# Patient Record
Sex: Male | Born: 1945 | Race: White | Hispanic: No | Marital: Married | Smoking: Never smoker
Health system: Southern US, Community
[De-identification: ages and names within clinical notes are randomized; demographics above are authoritative.]

## PROBLEM LIST (undated history)

## (undated) DIAGNOSIS — E039 Hypothyroidism, unspecified: Secondary | ICD-10-CM

## (undated) DIAGNOSIS — K219 Gastro-esophageal reflux disease without esophagitis: Secondary | ICD-10-CM

## (undated) DIAGNOSIS — I10 Essential (primary) hypertension: Secondary | ICD-10-CM

## (undated) HISTORY — DX: Gastro-esophageal reflux disease without esophagitis: K21.9

## (undated) HISTORY — DX: Essential (primary) hypertension: I10

## (undated) HISTORY — PX: THYROIDECTOMY: SHX17

## (undated) HISTORY — DX: Hypothyroidism, unspecified: E03.9

---

## 1998-03-31 ENCOUNTER — Encounter: Admission: RE | Admit: 1998-03-31 | Discharge: 1998-03-31 | Payer: Self-pay | Admitting: Internal Medicine

## 1998-04-19 ENCOUNTER — Ambulatory Visit (HOSPITAL_COMMUNITY): Admission: RE | Admit: 1998-04-19 | Discharge: 1998-04-19 | Payer: Self-pay | Admitting: *Deleted

## 1998-04-25 ENCOUNTER — Encounter: Payer: Self-pay | Admitting: *Deleted

## 1998-04-25 ENCOUNTER — Ambulatory Visit (HOSPITAL_COMMUNITY): Admission: RE | Admit: 1998-04-25 | Discharge: 1998-04-25 | Payer: Self-pay | Admitting: *Deleted

## 1998-06-07 ENCOUNTER — Encounter: Admission: RE | Admit: 1998-06-07 | Discharge: 1998-06-07 | Payer: Self-pay

## 1998-10-17 ENCOUNTER — Ambulatory Visit (HOSPITAL_COMMUNITY): Admission: RE | Admit: 1998-10-17 | Discharge: 1998-10-17 | Payer: Self-pay | Admitting: Urology

## 1998-10-17 ENCOUNTER — Encounter: Payer: Self-pay | Admitting: Urology

## 2004-01-04 ENCOUNTER — Ambulatory Visit: Payer: Self-pay | Admitting: Internal Medicine

## 2004-02-28 ENCOUNTER — Ambulatory Visit: Payer: Self-pay | Admitting: Family Medicine

## 2004-06-05 ENCOUNTER — Ambulatory Visit: Payer: Self-pay | Admitting: Internal Medicine

## 2004-06-12 ENCOUNTER — Ambulatory Visit: Payer: Self-pay | Admitting: Internal Medicine

## 2004-12-04 ENCOUNTER — Ambulatory Visit: Payer: Self-pay | Admitting: Internal Medicine

## 2004-12-11 ENCOUNTER — Ambulatory Visit: Payer: Self-pay | Admitting: Internal Medicine

## 2005-02-08 ENCOUNTER — Ambulatory Visit: Payer: Self-pay | Admitting: Internal Medicine

## 2005-05-03 ENCOUNTER — Ambulatory Visit: Payer: Self-pay | Admitting: Internal Medicine

## 2005-10-22 ENCOUNTER — Encounter: Payer: Self-pay | Admitting: Internal Medicine

## 2005-10-22 ENCOUNTER — Ambulatory Visit: Payer: Self-pay | Admitting: Internal Medicine

## 2005-10-22 DIAGNOSIS — I10 Essential (primary) hypertension: Secondary | ICD-10-CM | POA: Insufficient documentation

## 2005-10-22 DIAGNOSIS — E039 Hypothyroidism, unspecified: Secondary | ICD-10-CM

## 2006-01-03 ENCOUNTER — Ambulatory Visit: Payer: Self-pay | Admitting: Internal Medicine

## 2006-01-03 LAB — CONVERTED CEMR LAB: TSH: 1.29 microintl units/mL (ref 0.35–5.50)

## 2007-04-15 ENCOUNTER — Ambulatory Visit: Payer: Self-pay | Admitting: Internal Medicine

## 2007-04-15 DIAGNOSIS — R61 Generalized hyperhidrosis: Secondary | ICD-10-CM

## 2007-04-16 LAB — CONVERTED CEMR LAB
BUN: 12 mg/dL (ref 6–23)
CO2: 30 meq/L (ref 19–32)
Calcium: 9.1 mg/dL (ref 8.4–10.5)
Chloride: 109 meq/L (ref 96–112)
Creatinine, Ser: 1 mg/dL (ref 0.4–1.5)
GFR calc Af Amer: 98 mL/min
GFR calc non Af Amer: 81 mL/min
Glucose, Bld: 94 mg/dL (ref 70–99)
Potassium: 4.7 meq/L (ref 3.5–5.1)
Sodium: 143 meq/L (ref 135–145)
TSH: 2.72 microintl units/mL (ref 0.35–5.50)

## 2007-05-13 ENCOUNTER — Ambulatory Visit: Payer: Self-pay | Admitting: Internal Medicine

## 2007-07-07 ENCOUNTER — Ambulatory Visit: Payer: Self-pay | Admitting: Internal Medicine

## 2007-07-13 ENCOUNTER — Ambulatory Visit: Payer: Self-pay | Admitting: Internal Medicine

## 2008-02-29 ENCOUNTER — Ambulatory Visit: Payer: Self-pay | Admitting: Internal Medicine

## 2008-03-01 LAB — CONVERTED CEMR LAB
BUN: 15 mg/dL (ref 6–23)
CO2: 25 meq/L (ref 19–32)
Calcium: 9.5 mg/dL (ref 8.4–10.5)
Chloride: 107 meq/L (ref 96–112)
Creatinine, Ser: 0.9 mg/dL (ref 0.4–1.5)
GFR calc Af Amer: 111 mL/min
GFR calc non Af Amer: 91 mL/min
Glucose, Bld: 97 mg/dL (ref 70–99)
Potassium: 4.6 meq/L (ref 3.5–5.1)
Sodium: 144 meq/L (ref 135–145)
TSH: 1 microintl units/mL (ref 0.35–5.50)

## 2008-10-14 ENCOUNTER — Ambulatory Visit: Payer: Self-pay | Admitting: Internal Medicine

## 2008-10-14 DIAGNOSIS — G4710 Hypersomnia, unspecified: Secondary | ICD-10-CM

## 2008-10-17 LAB — CONVERTED CEMR LAB
ALT: 25 units/L (ref 0–53)
AST: 23 units/L (ref 0–37)
Albumin: 4.5 g/dL (ref 3.5–5.2)
Alkaline Phosphatase: 45 units/L (ref 39–117)
BUN: 14 mg/dL (ref 6–23)
Basophils Absolute: 0 10*3/uL (ref 0.0–0.1)
Basophils Relative: 0.3 % (ref 0.0–3.0)
Bilirubin, Direct: 0 mg/dL (ref 0.0–0.3)
CO2: 31 meq/L (ref 19–32)
Calcium: 9.1 mg/dL (ref 8.4–10.5)
Chloride: 107 meq/L (ref 96–112)
Cholesterol: 157 mg/dL (ref 0–200)
Creatinine, Ser: 1 mg/dL (ref 0.4–1.5)
Eosinophils Absolute: 0.2 10*3/uL (ref 0.0–0.7)
Eosinophils Relative: 3.5 % (ref 0.0–5.0)
GFR calc non Af Amer: 80.72 mL/min (ref >60)
Glucose, Bld: 103 mg/dL — ABNORMAL HIGH (ref 70–99)
HCT: 44.5 % (ref 39.0–52.0)
HDL: 38.6 mg/dL — ABNORMAL LOW (ref >39.00)
Hemoglobin: 15 g/dL (ref 13.0–17.0)
LDL Cholesterol: 105 mg/dL — ABNORMAL HIGH (ref 0–99)
Lymphocytes Relative: 20.7 % (ref 12.0–46.0)
Lymphs Abs: 1.3 10*3/uL (ref 0.7–4.0)
MCHC: 33.8 g/dL (ref 30.0–36.0)
MCV: 87 fL (ref 78.0–100.0)
Monocytes Absolute: 0.4 10*3/uL (ref 0.1–1.0)
Monocytes Relative: 6.6 % (ref 3.0–12.0)
Neutro Abs: 4.5 10*3/uL (ref 1.4–7.7)
Neutrophils Relative %: 68.9 % (ref 43.0–77.0)
PSA: 2.71 ng/mL (ref 0.10–4.00)
Platelets: 179 10*3/uL (ref 150.0–400.0)
Potassium: 4.7 meq/L (ref 3.5–5.1)
RBC: 5.11 M/uL (ref 4.22–5.81)
RDW: 12.2 % (ref 11.5–14.6)
Sodium: 144 meq/L (ref 135–145)
TSH: 0.68 microintl units/mL (ref 0.35–5.50)
Total Bilirubin: 1.2 mg/dL (ref 0.3–1.2)
Total CHOL/HDL Ratio: 4
Total Protein: 7 g/dL (ref 6.0–8.3)
Triglycerides: 68 mg/dL (ref 0.0–149.0)
VLDL: 13.6 mg/dL (ref 0.0–40.0)
WBC: 6.4 10*3/uL (ref 4.5–10.5)

## 2008-11-01 ENCOUNTER — Encounter: Payer: Self-pay | Admitting: Internal Medicine

## 2009-03-14 ENCOUNTER — Ambulatory Visit: Payer: Self-pay | Admitting: Internal Medicine

## 2009-07-31 ENCOUNTER — Telehealth: Payer: Self-pay | Admitting: Internal Medicine

## 2009-08-07 ENCOUNTER — Telehealth: Payer: Self-pay | Admitting: Internal Medicine

## 2009-08-29 ENCOUNTER — Encounter: Payer: Self-pay | Admitting: Internal Medicine

## 2009-08-30 ENCOUNTER — Ambulatory Visit: Payer: Self-pay | Admitting: Internal Medicine

## 2009-08-30 DIAGNOSIS — R079 Chest pain, unspecified: Secondary | ICD-10-CM | POA: Insufficient documentation

## 2009-08-31 LAB — CONVERTED CEMR LAB
Basophils Absolute: 0.1 10*3/uL (ref 0.0–0.1)
Basophils Relative: 1.3 % (ref 0.0–3.0)
CK-MB: 3.6 ng/mL (ref 0.3–4.0)
Eosinophils Absolute: 0.2 10*3/uL (ref 0.0–0.7)
Eosinophils Relative: 3.9 % (ref 0.0–5.0)
HCT: 42.6 % (ref 39.0–52.0)
Hemoglobin: 14.7 g/dL (ref 13.0–17.0)
Lymphocytes Relative: 24.7 % (ref 12.0–46.0)
Lymphs Abs: 1.5 10*3/uL (ref 0.7–4.0)
MCHC: 34.5 g/dL (ref 30.0–36.0)
MCV: 87.2 fL (ref 78.0–100.0)
Monocytes Absolute: 0.5 10*3/uL (ref 0.1–1.0)
Monocytes Relative: 8.1 % (ref 3.0–12.0)
Neutro Abs: 3.9 10*3/uL (ref 1.4–7.7)
Neutrophils Relative %: 62 % (ref 43.0–77.0)
Platelets: 193 10*3/uL (ref 150.0–400.0)
RBC: 4.89 M/uL (ref 4.22–5.81)
RDW: 13.4 % (ref 11.5–14.6)
Total CK: 194 units/L (ref 7–232)
WBC: 6.2 10*3/uL (ref 4.5–10.5)

## 2009-10-24 ENCOUNTER — Encounter (INDEPENDENT_AMBULATORY_CARE_PROVIDER_SITE_OTHER): Payer: Self-pay | Admitting: *Deleted

## 2010-02-20 NOTE — Assessment & Plan Note (Signed)
Summary: consult re: ems visit vesterday re: chest/discuss ekg that wa...   Vital Signs:  Patient profile:   65 year old male Weight:      253 pounds BMI:     33.96 Temp:     98.2 degrees F oral Pulse rate:   52 / minute Pulse rhythm:   regular Resp:     12 per minute BP sitting:   126 / 70  (left arm) Cuff size:   regular  Vitals Entered By: Gladis Riffle, RN (August 30, 2009 11:22 AM) CC: FU gas and chest discomfort last night; went to EMS station and had EKG done-- Is Patient Diabetic? No   CC:  FU gas and chest discomfort last night; went to EMS station and had EKG done--.  History of Present Illness: yesterday evening he had a lot of "gas" and bloating associated with right sided chest discomfort.  pain was mild---seemed to be worse when "breathing in" no nausea no radiation of pain He went to EMS station---had EKG--- he feels well this a.m.  Last week had one day of fatigue and not feeling well---sxs completely resolved within 24 hours.  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 Take 1 Tablet By Mouth Once A Day 2)  Lisinopril 40 Mg Tabs (Lisinopril) .... Take 1 Tablet By Mouth Once A Day--Make Appt For September 2011 3)  Drysol 20 %  Soln (Aluminum Chloride) .... Use Twice Weekly Prn 4)  Triamcinolone Acetonide 0.5 % Crea (Triamcinolone Acetonide) .... Apply Bid To Affected Area As Needed 5)  Levothroid 25 Mcg Tabs (Levothyroxine Sodium) .... Take 1/2 Daily in Addition To  Allergies (verified): No Known Drug Allergies  Past  History:  Past Medical History: Last updated: 07/07/2007 Hypertension Hypothyroidism  Past Surgical History: Last updated: 10/22/2005 Thyroidectomy cold nodule  Family History: Last updated: 04/15/2007 Family Hsitory Breast cancer 1st degree relative <50 age 64--mother father deceased pneumonia Family History Hypertension-brother, mother  Social History: Last updated: 07/07/2007 Married Never Smoked Regular exercise-no  Risk Factors: Exercise: no (10/22/2005)  Risk Factors: Smoking Status: never (08/30/2009)  Physical Exam  General:  overweight-appearing.  normal blood pressureoverweight-appearing.   Head:  normocephalic and atraumatic.   Eyes:  pupils equal and pupils round.   Ears:  R ear normal and L ear normal.   Neck:  No deformities, masses, or tenderness noted. Lungs:  normal respiratory effort and no intercostal retractions.   Heart:  normal rate and regular rhythm.   Abdomen:  soft and non-tender.   Skin:  turgor normal and color normal.   Psych:  good eye contact and not anxious appearing.     Impression & Recommendations:  Problem # 1:  CHEST PAIN (ICD-786.50) unclear etiology reviewed EKG---no concerning findings cp is atypical he will call if sxs recur Orders: Venipuncture ([address]) Specimen Handling ([address]) TLB-CBC Platelet - w/Differential (85025-CBCD) TLB-CK-MB (Creatine Kinase MB) (82553-CKMB) TLB-CK Total Only(Creatine Kinase/CPK) (82550-CK)  Complete Medication List: 1)  Levothroid 200 Mcg Tabs (Levothyroxine sodium) .... Take 1 tablet by mouth once a day 2)  Lisinopril 40 Mg Tabs (  Lisinopril) .... Take 1 tablet by mouth once a day--make appt for september 2011 3)  Drysol 20 % Soln (Aluminum chloride) .... Use twice weekly prn 4)  Triamcinolone Acetonide 0.5 % Crea (Triamcinolone acetonide) .... Apply bid to affected area as needed 5)  Levothroid 25 Mcg Tabs (Levothyroxine sodium) .... Take 1/2 daily in addition to  Other  Orders: Sleep Disorder Referral (Sleep Disorder) Gastroenterology Referral (GI)

## 2010-02-20 NOTE — Letter (Signed)
Summary: [facility] Referral (unable to schedule) Notification  [facility]  [address] [place], [place] [address]   Phone: [phone]  Fax: [phone]      October 24, 2009 QUANTAY ZAREMBA 06/19/1945 MRN: [id number]   ELYE HARMSEN [address] [place], [place]  [address]   Dear Dr. Cato Mulligan:   Thank you for your kind referral of the above patient. We have attempted to schedule the recommended Colonoscopy but have been unable to schedule because:  _x_ The patient was not available by phone and/or has not returned our calls.  __ The patient declined to schedule the procedure at this time.  We appreciate the referral and hope that we will have the opportunity to treat this patient in the future.    Sincerely,   [facility]  Vania Rea. Jarold Motto M.D. Hedwig Morton. Juanda Chance M.D. Venita Lick. Russella Dar M.D. Wilhemina Bonito. Marina Goodell M.D. Barbette Hair. Arlyce Dice M.D. Iva Boop M.D. Cheron Every.D.

## 2010-02-20 NOTE — Assessment & Plan Note (Signed)
Summary: chest congestion/dm   Vital Signs:  Patient profile:   65 year old male Weight:      254 pounds Temp:     98.4 degrees F oral BP sitting:   110 / 68  (right arm) Cuff size:   large  Vitals Entered By: Duard Brady LPN (March 14, 2009 3:36 PM) CC: c/o non-productive cough , congestion x1wk  Is Patient Diabetic? No   CC:  c/o non-productive cough  and congestion x1wk .  History of Present Illness: 65 year old patient with a 6 day history of cold symptoms.  Complaints include nonproductive cough head and chest congestion and sore throat.  No documented fever or chills.  He does treated hypertension and hypothyroidism.  Denies any sputum production, shortness of breath or chest pain.  His hypertension has been well controlled  Preventive Screening-Counseling & Management  Alcohol-Tobacco     Smoking Status: never  Allergies (verified): No Known Drug Allergies  Past History:  Past Medical History: Reviewed history from 07/07/2007 and no changes required. Hypertension Hypothyroidism  Review of Systems       The patient complains of anorexia, hoarseness, and prolonged cough.  The patient denies fever, weight loss, weight gain, vision loss, decreased hearing, chest pain, syncope, dyspnea on exertion, peripheral edema, headaches, hemoptysis, abdominal pain, melena, hematochezia, severe indigestion/heartburn, hematuria, incontinence, genital sores, muscle weakness, suspicious skin lesions, transient blindness, difficulty walking, depression, unusual weight change, abnormal bleeding, enlarged lymph nodes, angioedema, breast masses, and testicular masses.    Physical Exam  General:  overweight-appearing.  normal blood pressureoverweight-appearing.   Head:  Normocephalic and atraumatic without obvious abnormalities. No apparent alopecia or balding. Eyes:  No corneal or conjunctival inflammation noted. EOMI. Perrla. Funduscopic exam benign, without hemorrhages,  exudates or papilledema. Vision grossly normal. Ears:  External ear exam shows no significant lesions or deformities.  Otoscopic examination reveals clear canals, tympanic membranes are intact bilaterally without bulging, retraction, inflammation or discharge. Hearing is grossly normal bilaterally. Mouth:  Oral mucosa and oropharynx without lesions or exudates.  Teeth in good repair. Neck:  No deformities, masses, or tenderness noted. Lungs:  Normal respiratory effort, chest expands symmetrically. Lungs are clear to auscultation, no crackles or wheezes. Heart:  Normal rate and regular rhythm. S1 and S2 normal without gallop, murmur, click, rub or other extra sounds. Abdomen:  Bowel sounds positive,abdomen soft and non-tender without masses, organomegaly or hernias noted.   Impression & Recommendations:  Problem # 1:  URI (ICD-465.9)  His updated medication list for this problem includes:    Hydrocodone-homatropine 5-1.5 Mg/61ml Syrp (Hydrocodone-homatropine) .Marland Kitchen... Wanted him every 6 hours as needed for cough  His updated medication list for this problem includes:    Hydrocodone-homatropine 5-1.5 Mg/23ml Syrp (Hydrocodone-homatropine) .Marland Kitchen... Wanted him every 6 hours as needed for cough  Problem # 2:  HYPERTENSION (ICD-401.9)  His updated medication list for this problem includes:    Lisinopril 20 Mg Tabs (Lisinopril) .Marland Kitchen... 1 tablet by mouth daily  His updated medication list for this problem includes:    Lisinopril 20 Mg Tabs (Lisinopril) .Marland Kitchen... 1 tablet by mouth daily  Complete Medication List: 1)  Levothroid 200 Mcg Tabs (Levothyroxine sodium) .... Qd 2)  Lisinopril 20 Mg Tabs (Lisinopril) .Marland Kitchen.. 1 tablet by mouth daily 3)  Drysol 20 % Soln (Aluminum chloride) .... Use twice weekly prn 4)  Triamcinolone Acetonide 0.5 % Crea (Triamcinolone acetonide) .... Apply bid to affected area 5)  Hydrocodone-homatropine 5-1.5 Mg/56ml Syrp (Hydrocodone-homatropine) .... Wanted him every 6 hours  as  needed for cough  Patient Instructions: 1)  Get plenty of rest, drink lots of clear liquids, and use Tylenol  for fever and comfort. Return in 7-10 days if you're not better:sooner if you're feeling worse. 2)  Zipsor one every 6 hours  Prescriptions: HYDROCODONE-HOMATROPINE 5-1.5 MG/5ML SYRP (HYDROCODONE-HOMATROPINE) wanted him every 6 hours as needed for cough  #6 oz x 0   Entered and Authorized by:   Gordy Savers  MD   Signed by:   Gordy Savers  MD on 03/16/2009   Method used:   Samples Given   RxID:   [id number] HYDROCODONE-HOMATROPINE 5-1.5 MG/5ML SYRP (HYDROCODONE-HOMATROPINE) wanted him every 6 hours as needed for cough  #6 oz x 0   Entered and Authorized by:   Gordy Savers  MD   Signed by:   Gordy Savers  MD on 03/14/2009   Method used:   Print then Give to Patient   RxID:   [id number] LISINOPRIL 20 MG TABS (LISINOPRIL) 1 tablet by mouth daily  #90 x 3   Entered and Authorized by:   Gordy Savers  MD   Signed by:   Gordy Savers  MD on 03/14/2009   Method used:   Print then Give to Patient   RxID:   [id number]

## 2010-02-20 NOTE — Progress Notes (Signed)
Summary: lisinopril dose  Phone Note From Pharmacy Call back at [phone]   Caller: [facility] via [employer] Call For: swords  Summary of Call: Pt has been taking lisinopril 40mg .  Was written for 20mg , but pt taking two once daily.  Wants #90 of the 40mg . Initial call taken by: Gladis Riffle, RN,  August 07, 2009 12:14 PM  Follow-up for Phone Call        10/14/08 was changed to 20mg  once daily as BP well controlled.  Pt never changed dose so has been taking 2 of the 20mg  tabs and is now out.  Needs refill. Left message on machine. Pt to call back to discuss. Follow-up by: Gladis Riffle, RN,  August 07, 2009 12:16 PM  Additional Follow-up for Phone Call Additional follow up Details #1::        called back and says had lost weight and lisinopril had been reduced to 20mg .  Has again gained weight back and has been taking 40mg  and this is what he needs.  Call to [facility] dr.  See Rx.  Patient notified via voice mail on cell phone. Additional Follow-up by: Gladis Riffle, RN,  August 07, 2009 2:57 PM    New/Updated Medications: LISINOPRIL 40 MG TABS (LISINOPRIL) Take 1 tablet by mouth once a day--Make appt for September 2011 Prescriptions: LISINOPRIL 40 MG TABS (LISINOPRIL) Take 1 tablet by mouth once a day--Make appt for September 2011  #90 x 0   Entered by:   Gladis Riffle, RN   Authorized by:   Birdie Sons MD   Signed by:   Gladis Riffle, RN on 08/07/2009   Method used:   Electronically to        [id number]       [address]       [place], [place]  [address]       Ph: [phone]       Fax: [phone]   RxID:   [id number]

## 2010-02-20 NOTE — Progress Notes (Signed)
Summary: lisinopril refill  Phone Note Refill Request Message from:  Fax from Pharmacy on July 31, 2009 4:35 PM  Refills Requested: Medication #1:  LISINOPRIL 20 MG TABS 1 tablet by mouth daily Initial call taken by: Kern Reap CMA Duncan Dull),  July 31, 2009 4:35 PM    Prescriptions: LISINOPRIL 20 MG TABS (LISINOPRIL) 1 tablet by mouth daily  #90 x 1   Entered by:   Kern Reap CMA (AAMA)   Authorized by:   Birdie Sons MD   Signed by:   Kern Reap CMA (AAMA) on 07/31/2009   Method used:   [id number]       [address]       [place], [place]  [address]       Ph: [phone] or [phone]       Fax: [phone]   RxID:   [id number]

## 2010-05-03 ENCOUNTER — Other Ambulatory Visit: Payer: Self-pay | Admitting: Internal Medicine

## 2010-05-03 DIAGNOSIS — E039 Hypothyroidism, unspecified: Secondary | ICD-10-CM

## 2010-11-04 ENCOUNTER — Other Ambulatory Visit: Payer: Self-pay | Admitting: Internal Medicine

## 2010-11-08 ENCOUNTER — Encounter: Payer: Self-pay | Admitting: Internal Medicine

## 2010-11-09 ENCOUNTER — Ambulatory Visit (INDEPENDENT_AMBULATORY_CARE_PROVIDER_SITE_OTHER): Payer: BC Managed Care – PPO | Admitting: Internal Medicine

## 2010-11-09 ENCOUNTER — Encounter: Payer: Self-pay | Admitting: Internal Medicine

## 2010-11-09 DIAGNOSIS — E039 Hypothyroidism, unspecified: Secondary | ICD-10-CM

## 2010-11-09 DIAGNOSIS — I10 Essential (primary) hypertension: Secondary | ICD-10-CM

## 2010-11-09 DIAGNOSIS — Z0000 Encounter for general adult medical examination without abnormal findings: Secondary | ICD-10-CM

## 2010-11-09 LAB — LIPID PANEL
Cholesterol: 160 mg/dL (ref 0–200)
VLDL: 16.2 mg/dL (ref 0.0–40.0)

## 2010-11-09 LAB — TSH: TSH: 1 u[IU]/mL (ref 0.35–5.50)

## 2010-11-09 LAB — BASIC METABOLIC PANEL
Chloride: 107 mEq/L (ref 96–112)
Creatinine, Ser: 1.1 mg/dL (ref 0.4–1.5)
GFR: 75.79 mL/min (ref >60.00)
Potassium: 5.2 mEq/L — ABNORMAL HIGH (ref 3.5–5.1)

## 2010-11-09 MED ORDER — LEVOTHYROXINE SODIUM 25 MCG PO TABS: 25.0000 ug | ORAL_TABLET | Freq: Every day | ORAL | Status: DC

## 2010-11-09 MED ORDER — LISINOPRIL 40 MG PO TABS: 40.0000 mg | ORAL_TABLET | Freq: Every day | ORAL | Status: DC

## 2010-11-09 MED ORDER — LEVOTHYROXINE SODIUM 200 MCG PO TABS: 200.0000 ug | ORAL_TABLET | Freq: Every day | ORAL | Status: DC

## 2010-11-09 NOTE — Progress Notes (Signed)
  Subjective:    Patient ID: Darrell Griffin, male    DOB: Apr 26, 1945, 65 y.o.   MRN: [id number]  HPI  Hypothyroid---needs f/u. 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 Hypertension   . Hypothyroidism    Past Surgical History  Procedure Date  . Thyroidectomy     reports that he has never smoked. He does not have any smokeless tobacco history on file. His alcohol and drug histories not on file. family history includes Cancer in his mother and Hypertension in his brother and mother. No Known Allergies   Review of Systems  patient denies chest pain, shortness of breath, orthopnea. Denies lower extremity edema, abdominal pain, change in appetite, change in bowel movements. Patient denies rashes, musculoskeletal complaints. No other specific complaints in a complete review of systems.      Objective:   Physical Exam   well-developed well-nourished male in no acute distress. HEENT exam atraumatic, normocephalic, neck supple without jugular venous distention. Chest clear to auscultation cardiac exam S1-S2 are regular. Abdominal exam overweight with bowel sounds, soft and nontender. Extremities no edema. Neurologic exam is alert with a normal gait.       Assessment & Plan:

## 2011-12-08 ENCOUNTER — Other Ambulatory Visit: Payer: Self-pay | Admitting: Internal Medicine

## 2011-12-13 ENCOUNTER — Other Ambulatory Visit: Payer: Self-pay | Admitting: *Deleted

## 2011-12-13 DIAGNOSIS — I10 Essential (primary) hypertension: Secondary | ICD-10-CM

## 2011-12-13 DIAGNOSIS — E039 Hypothyroidism, unspecified: Secondary | ICD-10-CM

## 2011-12-13 MED ORDER — LISINOPRIL 40 MG PO TABS: 40.0000 mg | ORAL_TABLET | Freq: Every day | ORAL | Status: DC

## 2011-12-13 MED ORDER — LEVOTHYROXINE SODIUM 200 MCG PO TABS: 200.0000 ug | ORAL_TABLET | Freq: Every day | ORAL | Status: DC

## 2011-12-13 MED ORDER — LEVOTHYROXINE SODIUM 25 MCG PO TABS: 25.0000 ug | ORAL_TABLET | Freq: Every day | ORAL | Status: DC

## 2011-12-25 ENCOUNTER — Ambulatory Visit (INDEPENDENT_AMBULATORY_CARE_PROVIDER_SITE_OTHER): Payer: BC Managed Care – PPO | Admitting: Internal Medicine

## 2011-12-25 ENCOUNTER — Encounter: Payer: Self-pay | Admitting: Internal Medicine

## 2011-12-25 VITALS — BP 118/78 | HR 92 | Temp 98.1°F | Ht 73.0 in | Wt 235.0 lb

## 2011-12-25 DIAGNOSIS — E669 Obesity, unspecified: Secondary | ICD-10-CM | POA: Insufficient documentation

## 2011-12-25 DIAGNOSIS — E66811 Obesity, class 1: Secondary | ICD-10-CM | POA: Insufficient documentation

## 2011-12-25 DIAGNOSIS — Z2911 Encounter for prophylactic immunotherapy for respiratory syncytial virus (RSV): Secondary | ICD-10-CM

## 2011-12-25 DIAGNOSIS — I10 Essential (primary) hypertension: Secondary | ICD-10-CM

## 2011-12-25 DIAGNOSIS — Z23 Encounter for immunization: Secondary | ICD-10-CM

## 2011-12-25 DIAGNOSIS — Z0000 Encounter for general adult medical examination without abnormal findings: Secondary | ICD-10-CM

## 2011-12-25 DIAGNOSIS — E039 Hypothyroidism, unspecified: Secondary | ICD-10-CM

## 2011-12-25 DIAGNOSIS — E663 Overweight: Secondary | ICD-10-CM

## 2011-12-25 LAB — POCT URINALYSIS DIPSTICK
Leukocytes, UA: NEGATIVE
Protein, UA: NEGATIVE
Urobilinogen, UA: 1

## 2011-12-25 LAB — PSA: PSA: 3.21 ng/mL (ref 0.10–4.00)

## 2011-12-25 LAB — BASIC METABOLIC PANEL
Calcium: 9.4 mg/dL (ref 8.4–10.5)
GFR: 78.98 mL/min (ref >60.00)
Sodium: 140 mEq/L (ref 135–145)

## 2011-12-25 LAB — TSH: TSH: 0.76 u[IU]/mL (ref 0.35–5.50)

## 2011-12-25 NOTE — Progress Notes (Signed)
Patient ID: Darrell Griffin, male   DOB: [birth year], 66 y.o.   MRN: [id number] Has had 2 episodes of epididymitis-   Has hx of rectal pain due to hemorrhoid  Hypothyroid-- needs labs  htn-- tolerating meds, needs f/u  Reviewed pmh, psh, soc hx   patient denies chest pain, shortness of breath, orthopnea. Denies lower extremity edema, abdominal pain, change in appetite, change in bowel movements. Patient denies rashes, musculoskeletal complaints. No other specific complaints in a complete review of systems.  Marland Kitchen  well-developed well-nourished male in no acute distress. HEENT exam atraumatic, normocephalic, neck supple without jugular venous distention. Chest clear to auscultation cardiac exam S1-S2 are regular. Abdominal exam overweight with bowel sounds, soft and nontender. Extremities no edema. Neurologic exam is alert with a normal gait.   A/p Screening: refer colonoscopy

## 2011-12-26 NOTE — Assessment & Plan Note (Signed)
Controlled Continue same meds 

## 2011-12-26 NOTE — Assessment & Plan Note (Signed)
Check tsh today 

## 2012-01-27 ENCOUNTER — Emergency Department: Payer: Self-pay | Admitting: Emergency Medicine

## 2012-01-27 LAB — CBC WITH DIFFERENTIAL/PLATELET
Basophil #: 0 10*3/uL (ref 0.0–0.1)
Basophil %: 0.1 %
Eosinophil #: 0 10*3/uL (ref 0.0–0.7)
Eosinophil %: 0.4 %
HGB: 16 g/dL (ref 13.0–18.0)
Lymphocyte %: 1.7 %
MCH: 28.6 pg (ref 26.0–34.0)
MCHC: 33.5 g/dL (ref 32.0–36.0)
Monocyte #: 0.4 x10 3/mm (ref 0.2–1.0)
Neutrophil #: 11.7 10*3/uL — ABNORMAL HIGH (ref 1.4–6.5)
Platelet: 140 10*3/uL — ABNORMAL LOW (ref 150–440)
RBC: 5.57 10*6/uL (ref 4.40–5.90)
RDW: 13.4 % (ref 11.5–14.5)
WBC: 12.3 10*3/uL — ABNORMAL HIGH (ref 3.8–10.6)

## 2012-01-27 LAB — COMPREHENSIVE METABOLIC PANEL
Anion Gap: 9 (ref 7–16)
Bilirubin,Total: 1.1 mg/dL — ABNORMAL HIGH (ref 0.2–1.0)
Chloride: 113 mmol/L — ABNORMAL HIGH (ref 98–107)
EGFR (Non-African Amer.): >60
Osmolality: 291 (ref 275–301)
SGOT(AST): 31 U/L (ref 15–37)
Sodium: 144 mmol/L (ref 136–145)

## 2012-03-17 ENCOUNTER — Other Ambulatory Visit: Payer: Self-pay | Admitting: Internal Medicine

## 2012-06-22 ENCOUNTER — Other Ambulatory Visit: Payer: Self-pay | Admitting: Internal Medicine

## 2012-09-23 ENCOUNTER — Other Ambulatory Visit: Payer: Self-pay | Admitting: Internal Medicine

## 2012-12-21 ENCOUNTER — Other Ambulatory Visit: Payer: Self-pay | Admitting: Internal Medicine

## 2012-12-25 ENCOUNTER — Other Ambulatory Visit: Payer: Self-pay | Admitting: Internal Medicine

## 2013-03-29 ENCOUNTER — Other Ambulatory Visit: Payer: Self-pay | Admitting: Internal Medicine

## 2013-03-31 ENCOUNTER — Telehealth: Payer: Self-pay | Admitting: Internal Medicine

## 2013-03-31 ENCOUNTER — Other Ambulatory Visit: Payer: Self-pay | Admitting: Internal Medicine

## 2013-03-31 MED ORDER — LEVOTHYROXINE SODIUM 200 MCG PO TABS: ORAL_TABLET | ORAL | Status: DC

## 2013-03-31 NOTE — Telephone Encounter (Signed)
Pt has appt sch for 04-28-13 8am. Pt needs refill on levothyroxine 200 mg and levothyroxine 25 mg #90 each sent to [facility] dr in [place]

## 2013-03-31 NOTE — Telephone Encounter (Signed)
rx sent in electronically 

## 2013-03-31 NOTE — Telephone Encounter (Signed)
Patient Information:  Caller Name: Darrell Griffin  Phone: [phone]  Patient: Darrell Griffin  Gender: Male  DOB: 1945/09/01  Age: 68 Years  PCP: Darrell Griffin (Adults only)  Office Follow Up:  Does the office need to follow up with this patient?: No  Instructions For The Office: N/A  RN Note:  Patient would like to wait to see if the headache comes back again before scheduling an appointment. Advised to call back at first sign of head pain. Patient verbalized understanding.  Symptoms  Reason For Call & Symptoms: Recurring headaches. No pain reported at this time.  Reviewed Health History In EMR: Yes  Reviewed Medications In EMR: Yes  Reviewed Allergies In EMR: Yes  Reviewed Surgeries / Procedures: Yes  Date of Onset of Symptoms: 03/29/2013  Treatments Tried: Tylenol  Treatments Tried Worked: Yes  Guideline(s) Used:  No Protocol Available - Information Only  Disposition Per Guideline:   Home Care  Reason For Disposition Reached:   Information only question and nurse able to answer  Advice Given:  N/A  Patient Will Follow Care Advice:  YES

## 2013-04-28 ENCOUNTER — Ambulatory Visit: Payer: BC Managed Care – PPO | Admitting: Internal Medicine

## 2013-04-28 ENCOUNTER — Ambulatory Visit (INDEPENDENT_AMBULATORY_CARE_PROVIDER_SITE_OTHER): Payer: BC Managed Care – PPO | Admitting: Family

## 2013-04-28 ENCOUNTER — Encounter: Payer: Self-pay | Admitting: Family

## 2013-04-28 VITALS — BP 120/70 | HR 81 | Temp 98.3°F | Ht 73.0 in | Wt 263.0 lb

## 2013-04-28 DIAGNOSIS — I10 Essential (primary) hypertension: Secondary | ICD-10-CM

## 2013-04-28 DIAGNOSIS — E039 Hypothyroidism, unspecified: Secondary | ICD-10-CM

## 2013-04-28 DIAGNOSIS — Z1211 Encounter for screening for malignant neoplasm of colon: Secondary | ICD-10-CM

## 2013-04-28 LAB — BASIC METABOLIC PANEL
BUN: 13 mg/dL (ref 6–23)
CO2: 28 meq/L (ref 19–32)
Calcium: 8.8 mg/dL (ref 8.4–10.5)
Chloride: 104 mEq/L (ref 96–112)
Creatinine, Ser: 1 mg/dL (ref 0.4–1.5)
GFR: 78.65 mL/min (ref >60.00)
GLUCOSE: 96 mg/dL (ref 70–99)
POTASSIUM: 4 meq/L (ref 3.5–5.1)
Sodium: 140 mEq/L (ref 135–145)

## 2013-04-28 LAB — TSH: TSH: 0.87 u[IU]/mL (ref 0.35–5.50)

## 2013-04-28 MED ORDER — LEVOTHYROXINE SODIUM 200 MCG PO TABS: 200.0000 ug | ORAL_TABLET | Freq: Every day | ORAL | Status: DC

## 2013-04-28 MED ORDER — LEVOTHYROXINE SODIUM 25 MCG PO TABS: ORAL_TABLET | ORAL | Status: DC

## 2013-04-28 MED ORDER — LISINOPRIL 40 MG PO TABS: ORAL_TABLET | ORAL | Status: DC

## 2013-04-28 NOTE — Progress Notes (Signed)
   Subjective:    Patient ID: Darrell Griffin, male    DOB: July 18, 1945, 68 y.o.   MRN: [id number]  HPI  68 year old white male, nonsmoker, patient of Dr. Leanne Chang in today for recheck of hypertension and hypothyroidism. He is currently stable on all of his medications. Denies any concerns. Has not had a colonoscopy in 16 years. He is due for complete physical exam.   Review of Systems  Constitutional: Negative.   Respiratory: Negative.   Cardiovascular: Negative.   Gastrointestinal: Negative.   Endocrine: Negative.   Genitourinary: Negative.   Musculoskeletal: Negative.   Skin: Negative.   Neurological: Negative.   Hematological: Negative.    Past Medical History  Diagnosis Date  . Hypertension   . Hypothyroidism     History   Social History  . Marital Status: Married    Spouse Name: N/A    Number of Children: N/A  . Years of Education: N/A   Occupational History  . Not on file.   Social History Main Topics  . Smoking status: Never Smoker   . Smokeless tobacco: Not on file  . Alcohol Use:   . Drug Use:   . Sexual Activity:    Other Topics Concern  . Not on file   Social History Narrative  . No narrative on file    Past Surgical History  Procedure Laterality Date  . Thyroidectomy      Family History  Problem Relation Age of Onset  . Cancer Mother     breast  . Hypertension Mother   . Hypertension Brother     No Known Allergies  No current outpatient prescriptions on file prior to visit.   No current facility-administered medications on file prior to visit.    BP 120/70  Pulse 81  Temp(Src) 98.3 F (36.8 C) (Oral)  Ht 6\' 1"  (1.854 m)  Wt 263 lb (119.296 kg)  BMI 34.71 kg/m2  SpO2 97%chart    Objective:   Physical Exam  Constitutional: He is oriented to person, place, and time. He appears well-developed and well-nourished.  HENT:  Right Ear: External ear normal.  Left Ear: External ear normal.  Neck: Normal range of motion. Neck supple.    Cardiovascular: Normal rate, regular rhythm and normal heart sounds.   Pulmonary/Chest: Effort normal and breath sounds normal.  Abdominal: Soft. Bowel sounds are normal.  Musculoskeletal: Normal range of motion.  Neurological: He is alert and oriented to person, place, and time.  Skin: Skin is warm and dry.  Psychiatric: He has a normal mood and affect.          Assessment & Plan:  Darrell Griffin was seen today for follow-up.  Diagnoses and associated orders for this visit:  Unspecified hypothyroidism - TSH - Basic Metabolic Panel  Unspecified essential hypertension - TSH - Basic Metabolic Panel  Special screening for malignant neoplasms, colon - Ambulatory referral to Gastroenterology  Other Orders - lisinopril (PRINIVIL,ZESTRIL) 40 MG tablet; TAKE 1 TABLET BY MOUTH DAILY. - levothyroxine (SYNTHROID, LEVOTHROID) 200 MCG tablet; Take 1 tablet (200 mcg total) by mouth daily before breakfast. - levothyroxine (SYNTHROID, LEVOTHROID) 25 MCG tablet; TAKE 1 TABLET BY MOUTH DAILY.   Call the office with any questions or concerns. Recheck as scheduled and as needed.

## 2013-04-28 NOTE — Progress Notes (Signed)
Pre visit review using our clinic review tool, if applicable. No additional management support is needed unless otherwise documented below in the visit note. 

## 2013-04-28 NOTE — Patient Instructions (Signed)
Sodium-Controlled Diet Sodium is a mineral. It is found in many foods. Sodium may be found naturally or added during the making of a food. The most common form of sodium is salt, which is made up of sodium and chloride. Reducing your sodium intake involves changing your eating habits. The following guidelines will help you reduce the sodium in your diet:  Stop using the salt shaker.  Use salt sparingly in cooking and baking.  Substitute with sodium-free seasonings and spices.  Do not use a salt substitute (potassium chloride) without your caregiver's permission.  Include a variety of fresh, unprocessed foods in your diet.  Limit the use of processed and convenience foods that are high in sodium. USE THE FOLLOWING FOODS SPARINGLY: Breads/Starches  Commercial bread stuffing, commercial pancake or waffle mixes, coating mixes. Waffles. Croutons. Prepared (boxed or frozen) potato, rice, or noodle mixes that contain salt or sodium. Salted French fries or hash browns. Salted popcorn, breads, crackers, chips, or snack foods. Vegetables  Vegetables canned with salt or prepared in cream, butter, or cheese sauces. Sauerkraut. Tomato or vegetable juices canned with salt.  Fresh vegetables are allowed if rinsed thoroughly. Fruit  Fruit is okay to eat. Meat and Meat Substitutes  Salted or smoked meats, such as bacon or Canadian bacon, chipped or corned beef, hot dogs, salt pork, luncheon meats, pastrami, ham, or sausage. Canned or smoked fish, poultry, or meat. Processed cheese or cheese spreads, blue or Roquefort cheese. Battered or frozen fish products. Prepared spaghetti sauce. Baked beans. Reuben sandwiches. Salted nuts. Caviar. Milk  Limit buttermilk to 1 cup per week. Soups and Combination Foods  Bouillon cubes, canned or dried soups, broth, consomm. Convenience (frozen or packaged) dinners with more than 600 mg sodium. Pot pies, pizza, Asian food, fast food cheeseburgers, and specialty  sandwiches. Desserts and Sweets  Regular (salted) desserts, pie, commercial fruit snack pies, commercial snack cakes, canned puddings.  Eat desserts and sweets in moderation. Fats and Oils  Gravy mixes or canned gravy. No more than 1 to 2 tbs of salad dressing. Chip dips.  Eat fats and oils in moderation. Beverages  See those listed under the vegetables and milk groups. Condiments  Ketchup, mustard, meat sauces, salsa, regular (salted) and lite soy sauce or mustard. Dill pickles, olives, meat tenderizer. Prepared horseradish or pickle relish. Dutch-processed cocoa. Baking powder or baking soda used medicinally. Worcestershire sauce. "Light" salt. Salt substitute, unless approved by your caregiver. Document Released: 06/29/2001 Document Revised: 04/01/2011 Document Reviewed: 01/30/2009 Exit[employer] Patient Information 2014 [employer].  

## 2013-04-29 ENCOUNTER — Telehealth: Payer: Self-pay | Admitting: Internal Medicine

## 2013-04-29 NOTE — Telephone Encounter (Signed)
Pt is returning a call concerning blood work results.

## 2013-04-29 NOTE — Telephone Encounter (Signed)
Pt aware labs stable. 

## 2013-05-28 ENCOUNTER — Encounter: Payer: Self-pay | Admitting: Family

## 2013-10-05 ENCOUNTER — Other Ambulatory Visit: Payer: Self-pay | Admitting: Family

## 2014-01-01 ENCOUNTER — Other Ambulatory Visit: Payer: Self-pay | Admitting: Family

## 2014-01-16 ENCOUNTER — Other Ambulatory Visit: Payer: Self-pay | Admitting: Family

## 2014-01-25 ENCOUNTER — Telehealth: Payer: Self-pay | Admitting: Internal Medicine

## 2014-01-25 NOTE — Telephone Encounter (Signed)
Pt has an appt with dr hunter on 06-17-14. Pt needs refills on lisinopril 40 mg #90 , levothyroxine 25 mcg and 200 mcg #90 each with [facility] call [facility]

## 2014-01-26 MED ORDER — LISINOPRIL 40 MG PO TABS: 40.0000 mg | ORAL_TABLET | Freq: Every day | ORAL | Status: DC

## 2014-01-26 MED ORDER — LEVOTHYROXINE SODIUM 200 MCG PO TABS: ORAL_TABLET | ORAL | Status: DC

## 2014-01-26 NOTE — Telephone Encounter (Signed)
Refills sent in

## 2014-04-05 ENCOUNTER — Other Ambulatory Visit: Payer: Self-pay | Admitting: Family

## 2014-05-17 ENCOUNTER — Encounter: Payer: Self-pay | Admitting: Adult Health

## 2014-05-17 ENCOUNTER — Ambulatory Visit (INDEPENDENT_AMBULATORY_CARE_PROVIDER_SITE_OTHER): Payer: BC Managed Care – PPO | Admitting: Adult Health

## 2014-05-17 VITALS — BP 110/84 | HR 92 | Temp 98.4°F | Wt 257.9 lb

## 2014-05-17 DIAGNOSIS — J321 Chronic frontal sinusitis: Secondary | ICD-10-CM

## 2014-05-17 DIAGNOSIS — H6093 Unspecified otitis externa, bilateral: Secondary | ICD-10-CM | POA: Diagnosis not present

## 2014-05-17 MED ORDER — FLUTICASONE PROPIONATE 50 MCG/ACT NA SUSP: 2.0000 | Freq: Every day | NASAL | Status: DC

## 2014-05-17 MED ORDER — CIPROFLOXACIN-HYDROCORTISONE 0.2-1 % OT SUSP: 3.0000 [drp] | Freq: Two times a day (BID) | OTIC | Status: DC

## 2014-05-17 MED ORDER — ALBUTEROL SULFATE HFA 108 (90 BASE) MCG/ACT IN AERS: 2.0000 | INHALATION_SPRAY | Freq: Four times a day (QID) | RESPIRATORY_TRACT | Status: DC | PRN

## 2014-05-17 NOTE — Patient Instructions (Addendum)
I have sent a prescription to the pharmacy for Flonase, an albuteral inhaler, and antiobiotic ear drops. Take the as prescribed. Also pick up over the counter zyrtec. Drink plenty of fluids and get some rest. If you are not feeling any better by Friday, please let me know. If your symptoms become worse or you get a fever greater than 101, please follow up sooner. It was great meeting you today.   Sinusitis Sinusitis is redness, soreness, and inflammation of the paranasal sinuses. Paranasal sinuses are air pockets within the bones of your face (beneath the eyes, the middle of the forehead, or above the eyes). In healthy paranasal sinuses, mucus is able to drain out, and air is able to circulate through them by way of your nose. However, when your paranasal sinuses are inflamed, mucus and air can become trapped. This can allow bacteria and other germs to grow and cause infection. Sinusitis can develop quickly and last only a short time (acute) or continue over a long period (chronic). Sinusitis that lasts for more than 12 weeks is considered chronic.  CAUSES  Causes of sinusitis include:  Allergies.  Structural abnormalities, such as displacement of the cartilage that separates your nostrils (deviated septum), which can decrease the air flow through your nose and sinuses and affect sinus drainage.  Functional abnormalities, such as when the small hairs (cilia) that line your sinuses and help remove mucus do not work properly or are not present. SIGNS AND SYMPTOMS  Symptoms of acute and chronic sinusitis are the same. The primary symptoms are pain and pressure around the affected sinuses. Other symptoms include:  Upper toothache.  Earache.  Headache.  Bad breath.  Decreased sense of smell and taste.  A cough, which worsens when you are lying flat.  Fatigue.  Fever.  Thick drainage from your nose, which often is green and may contain pus (purulent).  Swelling and warmth over the  affected sinuses. DIAGNOSIS  Your health care provider will perform a physical exam. During the exam, your health care provider may:  Look in your nose for signs of abnormal growths in your nostrils (nasal polyps).  Tap over the affected sinus to check for signs of infection.  View the inside of your sinuses (endoscopy) using an imaging device that has a light attached (endoscope). If your health care provider suspects that you have chronic sinusitis, one or more of the following tests may be recommended:  Allergy tests.  Nasal culture. A sample of mucus is taken from your nose, sent to a lab, and screened for bacteria.  Nasal cytology. A sample of mucus is taken from your nose and examined by your health care provider to determine if your sinusitis is related to an allergy. TREATMENT  Most cases of acute sinusitis are related to a viral infection and will resolve on their own within 10 days. Sometimes medicines are prescribed to help relieve symptoms (pain medicine, decongestants, nasal steroid sprays, or saline sprays).  However, for sinusitis related to a bacterial infection, your health care provider will prescribe antibiotic medicines. These are medicines that will help kill the bacteria causing the infection.  Rarely, sinusitis is caused by a fungal infection. In theses cases, your health care provider will prescribe antifungal medicine. For some cases of chronic sinusitis, surgery is needed. Generally, these are cases in which sinusitis recurs more than 3 times per year, despite other treatments. HOME CARE INSTRUCTIONS   Drink plenty of water. Water helps thin the mucus so your sinuses  can drain more easily.  Use a humidifier.  Inhale steam 3 to 4 times a day (for example, sit in the bathroom with the shower running).  Apply a warm, moist washcloth to your face 3 to 4 times a day, or as directed by your health care provider.  Use saline nasal sprays to help moisten and clean  your sinuses.  Take medicines only as directed by your health care provider.  If you were prescribed either an antibiotic or antifungal medicine, finish it all even if you start to feel better. SEEK IMMEDIATE MEDICAL CARE IF:  You have increasing pain or severe headaches.  You have nausea, vomiting, or drowsiness.  You have swelling around your face.  You have vision problems.  You have a stiff neck.  You have difficulty breathing. MAKE SURE YOU:   Understand these instructions.  Will watch your condition.  Will get help right away if you are not doing well or get worse. Document Released: 01/07/2005 Document Revised: 05/24/2013 Document Reviewed: 01/22/2011 [facility] Patient Information 2015 [place], [place]. This information is not intended to replace advice given to you by your health care provider. Make sure you discuss any questions you have with your health care provider.

## 2014-05-17 NOTE — Progress Notes (Signed)
   Subjective:    Patient ID: Darrell Griffin, male    DOB: 03/31/1945, 68 y.o.   MRN: [id number]  HPI  Patient presents to the office today for sinusitis like symptoms. He was originally seen at [facility] on 4/5 (was feeling ill for approx. 2 weeks prior) and was given a course of Augmentin, he started to feel better for a couple of days but then his symptoms came back and he was given a Z-pack around 4/15. Again, his symptoms became better but then got worse again. He endorses a non productive cough, ringing in his right ear and green- mucus drainage from his nose. Denies fever, nausea, vomiting or diarrhea.   Review of Systems  Constitutional: Negative for fever, chills, diaphoresis, activity change, appetite change, fatigue and unexpected weight change.  HENT: Positive for congestion, rhinorrhea, sinus pressure and tinnitus. Negative for ear discharge, ear pain, facial swelling, sore throat, trouble swallowing and voice change.   Eyes: Negative for pain, redness and itching.  Respiratory: Positive for cough and wheezing (at night). Negative for shortness of breath and stridor.   Cardiovascular: Negative.   Gastrointestinal: Negative.   Genitourinary: Negative.   Musculoskeletal: Negative.   Neurological: Negative.        Objective:   Physical Exam  Constitutional: He is oriented to person, place, and time. He appears well-developed and well-nourished. No distress.  HENT:  Head: Normocephalic and atraumatic.  Nose: Nose normal.  Mouth/Throat: Oropharynx is clear and moist.  Otitis externa in bilateral ears R>L Frontal pressure headache.   Eyes: Conjunctivae are normal. Pupils are equal, round, and reactive to light. Right eye exhibits no discharge. Left eye exhibits no discharge.  Cardiovascular: Normal rate, regular rhythm, normal heart sounds and intact distal pulses.  Exam reveals no gallop and no friction rub.   No murmur heard. Pulmonary/Chest: Effort normal and breath sounds  normal. No respiratory distress. He has no wheezes. He has no rales. He exhibits no tenderness.  Musculoskeletal: Normal range of motion.  Lymphadenopathy:    He has cervical adenopathy.  Neurological: He is alert and oriented to person, place, and time. No cranial nerve deficit. Coordination normal.  Skin: Skin is warm and dry. No rash noted. No erythema. No pallor.  Psychiatric: He has a normal mood and affect. His behavior is normal. Judgment and thought content normal.  Nursing note and vitals reviewed.      Assessment & Plan:  1. Chronic frontal sinusitis - Not convinced that this is bacterial  - He has been on two courses of ABX this month. I have concerned for C-Diff if he has another round of ABX at this time.  - Use Flonase and Zyrtec  - Albuterol inhaler prescribed for use before bed time.  - Follow up if no improvement in 2-3 days or of symptoms worsen with a fever greater than 101.    2. Otitis externa, bilateral - Cipro ear drops prescribed, place in ears 3 times a day for seven days.

## 2014-05-17 NOTE — Progress Notes (Signed)
Pre visit review using our clinic review tool, if applicable. No additional management support is needed unless otherwise documented below in the visit note. 

## 2014-05-18 ENCOUNTER — Telehealth: Payer: Self-pay | Admitting: Internal Medicine

## 2014-05-18 NOTE — Telephone Encounter (Signed)
Called and spoke with pt and pt is aware.  

## 2014-05-18 NOTE — Telephone Encounter (Signed)
That is fine 

## 2014-05-18 NOTE — Telephone Encounter (Signed)
Pt said the pharmacy told him to take zyrtec d  . Pharmacy told pt that zyrtec d  will do the the same as zyrtec and sudafed.  Pt called to ask if this was ok and req a call back

## 2014-06-17 ENCOUNTER — Ambulatory Visit: Payer: BC Managed Care – PPO | Admitting: Family Medicine

## 2014-06-24 ENCOUNTER — Encounter: Payer: Self-pay | Admitting: Family Medicine

## 2014-06-24 ENCOUNTER — Ambulatory Visit (INDEPENDENT_AMBULATORY_CARE_PROVIDER_SITE_OTHER): Payer: BC Managed Care – PPO | Admitting: Family Medicine

## 2014-06-24 VITALS — BP 152/102 | HR 64 | Temp 98.2°F | Wt 262.0 lb

## 2014-06-24 DIAGNOSIS — E039 Hypothyroidism, unspecified: Secondary | ICD-10-CM

## 2014-06-24 DIAGNOSIS — Z23 Encounter for immunization: Secondary | ICD-10-CM

## 2014-06-24 DIAGNOSIS — Z1211 Encounter for screening for malignant neoplasm of colon: Secondary | ICD-10-CM | POA: Diagnosis not present

## 2014-06-24 DIAGNOSIS — Z0000 Encounter for general adult medical examination without abnormal findings: Secondary | ICD-10-CM

## 2014-06-24 DIAGNOSIS — I10 Essential (primary) hypertension: Secondary | ICD-10-CM

## 2014-06-24 DIAGNOSIS — E669 Obesity, unspecified: Secondary | ICD-10-CM

## 2014-06-24 NOTE — Progress Notes (Signed)
Darrell Reddish, MD Phone: [phone]  Subjective:  Patient presents today to establish care with me as their new primary care provider and for annual physical. Patient was formerly a patient of Dr. Leanne Chang. Chief complaint-noted.   Referred for colonoscopy  Agreeable to prevnar  Hypertension-poor control in office  BP Readings from Last 3 Encounters:  06/24/14 152/102  05/17/14 110/84  04/28/13 120/70   Home BP monitoring-no, advised Compliant with medications-yes without side effects, lisinopril 40mg  ROS-Denies any CP, HA, SOB, blurry vision, LE edema.   Hypothyroidism-controlled Postsurgical Lab Results  Component Value Date   TSH 0.87 04/28/2013   On thyroid medication-225 mcg levothyroxine ROS-No hair or nail changes. No heat/cold intolerance. No constipation or diarrhea. Denies shakiness or anxiety.   The following were reviewed and entered/updated in epic: Past Medical History  Diagnosis Date  . Hypertension   . Hypothyroidism    Patient Active Problem List   Diagnosis Date Noted  . Hypothyroidism 10/22/2005    Priority: Medium  . Essential hypertension 10/22/2005    Priority: Medium  . Obesity, Class I, BMI 30-34.9 12/25/2011    Priority: Low   Past Surgical History  Procedure Laterality Date  . Thyroidectomy      "cold spot", ultimately benign    Family History  Problem Relation Age of Onset  . Breast cancer Mother        . Hypertension Mother   . Hypertension Brother   . Seizures Father     past age 26    Medications- reviewed and updated Current Outpatient Prescriptions  Medication Sig Dispense Refill  . levothyroxine (SYNTHROID, LEVOTHROID) 200 MCG tablet TAKE 1 TABLET (200 MCG TOTAL) BY MOUTH DAILY BEFORE BREAKFAST. 90 tablet 1  . levothyroxine (SYNTHROID, LEVOTHROID) 25 MCG tablet TAKE 1 TABLET BY MOUTH DAILY. 90 tablet 1  . lisinopril (PRINIVIL,ZESTRIL) 40 MG tablet Take 1 tablet (40 mg total) by mouth daily. 90 tablet 1   No current  facility-administered medications for this visit.   Allergies-reviewed and updated No Known Allergies  History   Social History  . Marital Status: Married    Spouse Name: N/A  . Number of Children: N/A  . Years of Education: N/A   Social History Main Topics  . Smoking status: Never Smoker   . Smokeless tobacco: Not on file  . Alcohol Use: No  . Drug Use: No  . Sexual Activity: Not on file   Other Topics Concern  . None   Social History Narrative   Married. Daughter 55 in 2016. 5 granddaughters- 2 are stepdaughters.       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 Oropharynx normal Neck: no thyromegaly (in fact no thyroid) CV: RRR no murmurs rubs or gallops Lungs: CTAB no crackles, wheeze, rhonchi Abdomen: soft/nontender/nondistended/normal bowel sounds. No rebound or guarding.  Rectal: normal tone, normal prostate, no masses or tenderness Ext: no edema Skin: warm, dry, mild erythema bilateral elbows, no scaling, fading more on left than right Full skin exam planned next year Neuro: grossly normal, moves all extremities, PERRLA  Assessment/Plan:  69 y.o. male presenting for annual physical.  Health Maintenance counseling: 1. Anticipatory guidance: Patient counseled regarding regular dental exams, wearing seatbelts, regular eye  exams. Plan for full skin exam next year. 2. Risk factor reduction:  Advised patient of need for regular exercise and diet rich and fruits and vegetables to reduce risk of heart attack and stroke. Advised DASH diet and weight loss 3. Immunizations/screenings/ancillary studies Health Maintenance Due  Topic Date Due  . COLONOSCOPY - refer today 09/15/1997  . PNA vac Low Risk Adult (1 of 2 - PCV13)-  Prevnar 13 today 09/15/2012   Hypothyroidism Check TSH, controlled last year. Levothyroxine 259mcg   Essential hypertension Poor control for first time in a long time on lisinopril 40mg , home monitoring and one month follow up. No red flags. Check kidney function as well.    Obesity, Class I, BMI 30-34.9 Dash diet, exercise advised. BP up- weight related?   1 month BP follow up   Orders Placed This Encounter  Procedures  . Pneumococcal conjugate vaccine 13-valent  . CBC    [place]    Standing Status: Future     Number of Occurrences:      Standing Expiration Date: 06/24/2015  . Comprehensive metabolic panel    [place]    Standing Status: Future     Number of Occurrences:      Standing Expiration Date: 06/24/2015    Order Specific Question:  Has the patient fasted?    Answer:  No  . Lipid panel    [place]    Standing Status: Future     Number of Occurrences:      Standing Expiration Date: 06/24/2015    Order Specific Question:  Has the patient fasted?    Answer:  No  . PSA    Standing Status: Future     Number of Occurrences:      Standing Expiration Date: 06/24/2015  . TSH        Standing Status: Future     Number of Occurrences:      Standing Expiration Date: 06/24/2015  . Ambulatory referral to Gastroenterology    Referral Priority:  Routine    Referral Type:  Consultation    Referral Reason:  Specialty Services Required    Number of Visits Requested:  1

## 2014-06-24 NOTE — Assessment & Plan Note (Signed)
Poor control for first time in a long time on lisinopril 40mg , home monitoring and one month follow up. No red flags. Check kidney function as well.

## 2014-06-24 NOTE — Assessment & Plan Note (Signed)
Dash diet, exercise advised. BP up- weight related?

## 2014-06-24 NOTE — Patient Instructions (Addendum)
[facility] will call you to schedule your colonoscopy.  Prevnar today- first of 2 pneumonia vaccines  Blood pressure high here today for first time in a long time. Let's have you check at a pharmacy about once a week for the next 4 weeks and keep a log, come see me in 4 weeks and we will compare readings there to what we get here and decide if we need to make adjustments  We will also check in on kidney function to make sure not contributing to elevation when you return for labs  Also daily walking, weight loss should help. See DASH eating plan below  For your labs, I will send you a letter if there are no medication changes needed. I will call you if we need to discuss your lab results.   Schedule a lab visit at the front desk. Return for future fasting labs. Nothing but water after midnight please.   DASH Eating Plan DASH stands for "Dietary Approaches to Stop Hypertension." The DASH eating plan is a healthy eating plan that has been shown to reduce high blood pressure (hypertension). Additional health benefits may include reducing the risk of type 2 diabetes mellitus, heart disease, and stroke. The DASH eating plan may also help with weight loss. WHAT DO I NEED TO KNOW ABOUT THE DASH EATING PLAN? For the DASH eating plan, you will follow these general guidelines:  Choose foods with a percent daily value for sodium of less than 5% (as listed on the food label).  Use salt-free seasonings or herbs instead of table salt or sea salt.  Check with your health care provider or pharmacist before using salt substitutes.  Eat lower-sodium products, often labeled as "lower sodium" or "no salt added."  Eat fresh foods.  Eat more vegetables, fruits, and low-fat dairy products.  Choose whole grains. Look for the word "whole" as the first word in the ingredient list.  Choose fish and skinless chicken or [place] more often than red meat. Limit fish, poultry, and meat to 6 oz (170 g) each  day.  Limit sweets, desserts, sugars, and sugary drinks.  Choose heart-healthy fats.  Limit cheese to 1 oz (28 g) per day.  Eat more home-cooked food and less restaurant, buffet, and fast food.  Limit fried foods.  Cook foods using methods other than frying.  Limit canned vegetables. If you do use them, rinse them well to decrease the sodium.  When eating at a restaurant, ask that your food be prepared with less salt, or no salt if possible. WHAT FOODS CAN I EAT? Seek help from a dietitian for individual calorie needs. Grains Whole grain or whole wheat bread. Brown rice. Whole grain or whole wheat pasta. Quinoa, bulgur, and whole grain cereals. Low-sodium cereals. Corn or whole wheat flour tortillas. Whole grain cornbread. Whole grain crackers. Low-sodium crackers. Vegetables Fresh or frozen vegetables (raw, steamed, roasted, or grilled). Low-sodium or reduced-sodium tomato and vegetable juices. Low-sodium or reduced-sodium tomato sauce and paste. Low-sodium or reduced-sodium canned vegetables.  Fruits All fresh, canned (in natural juice), or frozen fruits. Meat and Other Protein Products Ground beef (85% or leaner), grass-fed beef, or beef trimmed of fat. Skinless chicken or [place]. Ground chicken or [place]. Pork trimmed of fat. All fish and seafood. Eggs. Dried beans, peas, or lentils. Unsalted nuts and seeds. Unsalted canned beans. Dairy Low-fat dairy products, such as skim or 1% milk, 2% or reduced-fat cheeses, low-fat ricotta or cottage cheese, or plain low-fat yogurt. Low-sodium or reduced-sodium  cheeses. Fats and Oils Tub margarines without trans fats. Light or reduced-fat mayonnaise and salad dressings (reduced sodium). Avocado. Safflower, olive, or canola oils. Natural peanut or almond butter. Other Unsalted popcorn and pretzels. The items listed above may not be a complete list of recommended foods or beverages. Contact your dietitian for more options. WHAT FOODS ARE NOT  RECOMMENDED? Grains White bread. White pasta. White rice. Refined cornbread. Bagels and croissants. Crackers that contain trans fat. Vegetables Creamed or fried vegetables. Vegetables in a cheese sauce. Regular canned vegetables. Regular canned tomato sauce and paste. Regular tomato and vegetable juices. Fruits Dried fruits. Canned fruit in light or heavy syrup. Fruit juice. Meat and Other Protein Products Fatty cuts of meat. Ribs, chicken wings, bacon, sausage, bologna, salami, chitterlings, fatback, hot dogs, bratwurst, and packaged luncheon meats. Salted nuts and seeds. Canned beans with salt. Dairy Whole or 2% milk, cream, half-and-half, and cream cheese. Whole-fat or sweetened yogurt. Full-fat cheeses or blue cheese. Nondairy creamers and whipped toppings. Processed cheese, cheese spreads, or cheese curds. Condiments Onion and garlic salt, seasoned salt, table salt, and sea salt. Canned and packaged gravies. Worcestershire sauce. Tartar sauce. Barbecue sauce. Teriyaki sauce. Soy sauce, including reduced sodium. Steak sauce. Fish sauce. Oyster sauce. Cocktail sauce. Horseradish. Ketchup and mustard. Meat flavorings and tenderizers. Bouillon cubes. Hot sauce. Tabasco sauce. Marinades. Taco seasonings. Relishes. Fats and Oils Butter, stick margarine, lard, shortening, ghee, and bacon fat. Coconut, palm kernel, or palm oils. Regular salad dressings. Other Pickles and olives. Salted popcorn and pretzels. The items listed above may not be a complete list of foods and beverages to avoid. Contact your dietitian for more information. WHERE CAN I FIND MORE INFORMATION? National Heart, Lung, and Blood Institute: travelstabloid.com Document Released: 12/27/2010 Document Revised: 05/24/2013 Document Reviewed: 11/11/2012 [facility] Patient Information 2015 [place], [place]. This information is not intended to replace advice given to you by your health care provider. Make  sure you discuss any questions you have with your health care provider.

## 2014-06-24 NOTE — Assessment & Plan Note (Addendum)
Check TSH, controlled last year. Levothyroxine 248mcg

## 2014-06-29 ENCOUNTER — Other Ambulatory Visit (INDEPENDENT_AMBULATORY_CARE_PROVIDER_SITE_OTHER): Payer: BC Managed Care – PPO

## 2014-06-29 DIAGNOSIS — Z0000 Encounter for general adult medical examination without abnormal findings: Secondary | ICD-10-CM | POA: Diagnosis not present

## 2014-06-29 LAB — COMPREHENSIVE METABOLIC PANEL
ALBUMIN: 4.4 g/dL (ref 3.5–5.2)
ALK PHOS: 42 U/L (ref 39–117)
ALT: 44 U/L (ref 0–53)
AST: 37 U/L (ref 0–37)
BUN: 13 mg/dL (ref 6–23)
CALCIUM: 9 mg/dL (ref 8.4–10.5)
CHLORIDE: 107 meq/L (ref 96–112)
CO2: 26 mEq/L (ref 19–32)
CREATININE: 1 mg/dL (ref 0.40–1.50)
GFR: 79.27 mL/min (ref >60.00)
Glucose, Bld: 92 mg/dL (ref 70–99)
Potassium: 4.3 mEq/L (ref 3.5–5.1)
SODIUM: 139 meq/L (ref 135–145)
Total Bilirubin: 1 mg/dL (ref 0.2–1.2)
Total Protein: 6.6 g/dL (ref 6.0–8.3)

## 2014-06-29 LAB — CBC
HCT: 46.7 % (ref 39.0–52.0)
Hemoglobin: 15.8 g/dL (ref 13.0–17.0)
MCHC: 33.8 g/dL (ref 30.0–36.0)
MCV: 85.2 fl (ref 78.0–100.0)
PLATELETS: 183 10*3/uL (ref 150.0–400.0)
RBC: 5.48 Mil/uL (ref 4.22–5.81)
RDW: 13.4 % (ref 11.5–15.5)
WBC: 6.6 10*3/uL (ref 4.0–10.5)

## 2014-06-29 LAB — LIPID PANEL
Cholesterol: 176 mg/dL (ref 0–200)
HDL: 40.8 mg/dL (ref >39.00)
LDL Cholesterol: 116 mg/dL — ABNORMAL HIGH (ref 0–99)
NONHDL: 135.2
TRIGLYCERIDES: 95 mg/dL (ref 0.0–149.0)
Total CHOL/HDL Ratio: 4
VLDL: 19 mg/dL (ref 0.0–40.0)

## 2014-06-29 LAB — TSH: TSH: 3.14 u[IU]/mL (ref 0.35–4.50)

## 2014-06-29 LAB — PSA: PSA: 3.19 ng/mL (ref 0.10–4.00)

## 2014-07-23 ENCOUNTER — Other Ambulatory Visit: Payer: Self-pay | Admitting: Family Medicine

## 2014-10-07 ENCOUNTER — Ambulatory Visit (INDEPENDENT_AMBULATORY_CARE_PROVIDER_SITE_OTHER): Payer: BC Managed Care – PPO | Admitting: Adult Health

## 2014-10-07 ENCOUNTER — Ambulatory Visit (INDEPENDENT_AMBULATORY_CARE_PROVIDER_SITE_OTHER)
Admission: RE | Admit: 2014-10-07 | Discharge: 2014-10-07 | Disposition: A | Discharge status: Home / Self Care | Payer: BC Managed Care – PPO | Source: Ambulatory Visit | Attending: Adult Health | Admitting: Adult Health

## 2014-10-07 ENCOUNTER — Other Ambulatory Visit: Payer: Self-pay | Admitting: Adult Health

## 2014-10-07 ENCOUNTER — Telehealth: Payer: Self-pay | Admitting: Adult Health

## 2014-10-07 ENCOUNTER — Encounter: Payer: Self-pay | Admitting: Adult Health

## 2014-10-07 VITALS — BP 164/100 | HR 70 | Temp 98.3°F | Wt 261.0 lb

## 2014-10-07 DIAGNOSIS — R062 Wheezing: Secondary | ICD-10-CM

## 2014-10-07 DIAGNOSIS — J069 Acute upper respiratory infection, unspecified: Secondary | ICD-10-CM

## 2014-10-07 DIAGNOSIS — I10 Essential (primary) hypertension: Secondary | ICD-10-CM

## 2014-10-07 MED ORDER — DOXYCYCLINE HYCLATE 100 MG PO CAPS: 100.0000 mg | ORAL_CAPSULE | Freq: Two times a day (BID) | ORAL | Status: DC

## 2014-10-07 MED ORDER — HYDROCHLOROTHIAZIDE 12.5 MG PO CAPS: 12.5000 mg | ORAL_CAPSULE | Freq: Every day | ORAL | Status: DC

## 2014-10-07 NOTE — Telephone Encounter (Signed)
Informed patient of ches x ray- negative. Will treat likely sinus infection with Doxy 100mg  BID x 7 days.

## 2014-10-07 NOTE — Patient Instructions (Addendum)
I will follow up with you regarding your chest x ray.   Follow up with Dr. Yong Channel in 1-2 weeks for your blood pressure.   Take the HCTZ daily   Stay well hydrated and get some rest.

## 2014-10-07 NOTE — Progress Notes (Addendum)
Subjective:    Patient ID: Darrell Griffin, male    DOB: 03/17/1945, 69 y.o.   MRN: [id number]  HPI  69 year old male who presents to the office today for dry cough, congestion, sinus drainage, chills and fever up to 101F for three days.   He has been using [place] and tylenol. The benadryl was effective for the rhinorrhea.   His blood pressure is also elevated. He last saw Dr. Yong Channel in June for his CPE and at that time it was elevated as well. He has been taking Lisinopril 40mg  for his HTN.  BP Readings from Last 3 Encounters:  10/07/14 164/100  06/24/14 152/102  05/17/14 110/84  Denies any headaches, blurred vision or dizziness.   Review of Systems  Constitutional: Positive for fever, chills, activity change and fatigue.  HENT: Positive for congestion, postnasal drip, rhinorrhea, sinus pressure and sore throat. Negative for ear discharge, ear pain, tinnitus and trouble swallowing.   Eyes: Negative.   Respiratory: Positive for cough and wheezing. Negative for shortness of breath.   Cardiovascular: Negative.   Musculoskeletal: Negative.   Neurological: Negative.   All other systems reviewed and are negative.  Past Medical History  Diagnosis Date  . Hypertension   . Hypothyroidism     Social History   Social History  . Marital Status: Married    Spouse Name: N/A  . Number of Children: N/A  . Years of Education: N/A   Occupational History  . Not on file.   Social History Main Topics  . Smoking status: Never Smoker   . Smokeless tobacco: Not on file  . Alcohol Use: No  . Drug Use: No  . Sexual Activity: Not on file   Other Topics Concern  . Not on file   Social History Narrative   Married. Daughter 93 in 2016. 5 granddaughters- 2 are stepdaughters.       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 Thyroidectomy      "cold spot", ultimately  benign    Family History  Problem Relation Age of Onset  . Breast cancer Mother        . Hypertension Mother   . Hypertension Brother   . Seizures Father     past age 7    No Known Allergies  Current Outpatient Prescriptions on File Prior to Visit  Medication Sig Dispense Refill  . levothyroxine (SYNTHROID, LEVOTHROID) 200 MCG tablet TAKE 1 TABLET (200 MCG TOTAL) BY MOUTH DAILY BEFORE BREAKFAST. 90 tablet 1  . lisinopril (PRINIVIL,ZESTRIL) 40 MG tablet TAKE 1 TABLET (40 MG TOTAL) BY MOUTH DAILY. 90 tablet 2   No current facility-administered medications on file prior to visit.    BP 164/100 mmHg  Pulse 70  Temp(Src) 98.3 F (36.8 C) (Oral)  Wt 261 lb (118.389 kg)  SpO2 98%       Objective:   Physical Exam  Constitutional: He is oriented to person, place, and time. He appears well-developed and well-nourished. No distress (Appears tired and worn out).  HENT:  Head: Normocephalic and atraumatic.  Right Ear: External ear normal.  Left Ear: External ear normal.  Nose: Nose normal.  Mouth/Throat: Oropharynx is clear and moist. No oropharyngeal exudate.  TM's visualized. No signs of infection  Eyes: Conjunctivae are normal. Pupils are equal, round, and reactive to light. Right eye exhibits  no discharge. Left eye exhibits no discharge.  Neck: Normal range of motion. Neck supple.  Cardiovascular: Normal rate, regular rhythm, normal heart sounds and intact distal pulses.  Exam reveals no friction rub.   No murmur heard. Pulmonary/Chest: Effort normal. No respiratory distress. He has wheezes (lower bases). He has no rales. He exhibits no tenderness.  Musculoskeletal: Normal range of motion. He exhibits no tenderness.  Lymphadenopathy:    He has cervical adenopathy.  Neurological: He is alert and oriented to person, place, and time.  Skin: Skin is warm and dry. No rash noted. He is not diaphoretic. No erythema.  Psychiatric: He has a normal mood and affect. His behavior is  normal. Judgment and thought content normal.  Nursing note and vitals reviewed.     Assessment & Plan:  1. Wheezing - DG Chest 2 View; Future 2. Essential hypertension - hydrochlorothiazide (MICROZIDE) 12.5 MG capsule; Take 1 capsule (12.5 mg total) by mouth daily.  Dispense: 30 capsule; Refill: 0 - Follow up with Dr Yong Channel in 1-2 weeks after starting medication - Start monitoring at home  3. Acute upper respiratory infection - Likely URI but cannot r/o PNA at this time.  - DG Chest 2 View; Future- Negative

## 2014-10-07 NOTE — Progress Notes (Signed)
Pre visit review using our clinic review tool, if applicable. No additional management support is needed unless otherwise documented below in the visit note. 

## 2014-10-08 ENCOUNTER — Other Ambulatory Visit: Payer: Self-pay | Admitting: Adult Health

## 2014-10-17 ENCOUNTER — Other Ambulatory Visit: Payer: Self-pay | Admitting: Family Medicine

## 2014-10-29 ENCOUNTER — Other Ambulatory Visit: Payer: Self-pay | Admitting: Family Medicine

## 2014-12-05 ENCOUNTER — Other Ambulatory Visit: Payer: Self-pay | Admitting: Adult Health

## 2014-12-05 MED ORDER — HYDROCHLOROTHIAZIDE 12.5 MG PO CAPS: ORAL_CAPSULE | ORAL | Status: DC

## 2014-12-05 NOTE — Addendum Note (Signed)
Addended by: Colleen Can on: 12/05/2014 11:56 AM   Modules accepted: Orders

## 2015-01-09 ENCOUNTER — Other Ambulatory Visit: Payer: Self-pay | Admitting: Adult Health

## 2015-02-02 ENCOUNTER — Encounter: Payer: Self-pay | Admitting: Family Medicine

## 2015-02-02 ENCOUNTER — Ambulatory Visit (INDEPENDENT_AMBULATORY_CARE_PROVIDER_SITE_OTHER): Payer: BC Managed Care – PPO | Admitting: Family Medicine

## 2015-02-02 VITALS — BP 130/70 | HR 67 | Temp 98.3°F | Wt 266.0 lb

## 2015-02-02 DIAGNOSIS — R05 Cough: Secondary | ICD-10-CM | POA: Diagnosis not present

## 2015-02-02 DIAGNOSIS — R0981 Nasal congestion: Secondary | ICD-10-CM | POA: Diagnosis not present

## 2015-02-02 DIAGNOSIS — R059 Cough, unspecified: Secondary | ICD-10-CM

## 2015-02-02 MED ORDER — AZITHROMYCIN 250 MG PO TABS: ORAL_TABLET | ORAL | Status: DC

## 2015-02-02 NOTE — Patient Instructions (Signed)
Sinsusitis vs. Upper respiratory infection (the [place] crud perhaps) Bacterial based on: Symptoms >10 days, double sickening. I will say his overall symptom pattern includes sinus congestion and pressure but this is not the foremost element- also cough and runny nose bothering him. We discussed trial of depo medrol injection vs. Watch and wait vs. Antibiotic. With some typical and some atypical features for sinusitis, we ultimately opted to treat with azithromycin which may have some antiinflammatory properties as well. If symptoms do not resolve with this, would reevaluate in office in another 2 weeks.   Finally, we reviewed reasons to return to care including if symptoms worsen (especially fever or shortness of breath)  or persist or new concerns arise.  Meds ordered this encounter  Medications  . azithromycin (ZITHROMAX) 250 MG tablet    Sig: Take 2 tabs on day 1, then 1 tab daily until finished    Dispense:  6 tablet    Refill:  0

## 2015-02-02 NOTE — Progress Notes (Signed)
PCP: Garret Reddish, MD  Subjective:  Darrell Griffin is a 70 y.o. year old very pleasant male patient who presents with sinusitis vs URI symptoms including nasal congestion, sinus tenderness, cough -other symptoms include: some runny nose -day of illness: 20 -started: Christmas Eve -Symptoms are worsening after having some improvement -previous treatments: dayquil, nqyquil -sick contacts/travel/risks: denies flu exposure.  Several sick contacts with similar symptoms  ROS-denies fever, SOB, NVD, tooth pain  Pertinent Past Medical History-  Patient Active Problem List   Diagnosis Date Noted  . Hypothyroidism 10/22/2005    Priority: Medium  . Essential hypertension 10/22/2005    Priority: Medium  . Obesity, Class I, BMI 30-34.9 12/25/2011    Priority: Low    Medications- reviewed  Current Outpatient Prescriptions  Medication Sig Dispense Refill  . hydrochlorothiazide (MICROZIDE) 12.5 MG capsule TAKE 1 CAPSULE (12.5 MG TOTAL) BY MOUTH DAILY. 30 capsule 5  . levothyroxine (SYNTHROID, LEVOTHROID) 200 MCG tablet TAKE 1 TABLET (200 MCG TOTAL) BY MOUTH DAILY BEFORE BREAKFAST. 90 tablet 3  . lisinopril (PRINIVIL,ZESTRIL) 40 MG tablet TAKE 1 TABLET (40 MG TOTAL) BY MOUTH DAILY. 90 tablet 2   No current facility-administered medications for this visit.    Objective: BP 130/70 mmHg  Pulse 67  Temp(Src) 98.3 F (36.8 C)  Wt 266 lb (120.657 kg)  SpO2 97% Gen: NAD, resting comfortably HEENT: Turbinates erythematous with yellow drainage, TM normal, pharynx mildly erythematous with no tonsilar exudate or edema, minimal sinus tenderness CV: RRR no murmurs rubs or gallops Lungs: CTAB no crackles, wheeze, rhonchi Abdomen: soft/nontender/nondistended/normal bowel sounds. No rebound or guarding.  Ext: no edema Skin: warm, dry, no rash Neuro: grossly normal, moves all extremities  Assessment/Plan:  Sinsusitis Bacterial based on: Symptoms >10 days, double sickening. I will say his overall  symptom pattern includes sinus congestion and pressure but this is not the foremost element- also cough and runny nose bothering him. We discussed trial of depo medrol injection vs. Watch and wait vs. Antibiotic. With some typical and some atypical features for sinusitis, we ultimately opted to treat with azithromycin which may have some antiinflammatory properties as well. If symptoms do not resolve with this, would reevaluate in office in another 2 weeks.   Finally, we reviewed reasons to return to care including if symptoms worsen (especially fever or shortness of breath)  or persist or new concerns arise.  Meds ordered this encounter  Medications  . azithromycin (ZITHROMAX) 250 MG tablet    Sig: Take 2 tabs on day 1, then 1 tab daily until finished    Dispense:  6 tablet    Refill:  0

## 2015-05-08 ENCOUNTER — Other Ambulatory Visit: Payer: Self-pay | Admitting: Family Medicine

## 2015-07-08 ENCOUNTER — Other Ambulatory Visit: Payer: Self-pay | Admitting: Adult Health

## 2015-10-29 ENCOUNTER — Other Ambulatory Visit: Payer: Self-pay | Admitting: Family Medicine

## 2015-11-12 ENCOUNTER — Other Ambulatory Visit: Payer: Self-pay | Admitting: Family Medicine

## 2016-01-17 ENCOUNTER — Other Ambulatory Visit: Payer: Self-pay | Admitting: Family Medicine

## 2016-01-17 NOTE — Telephone Encounter (Signed)
Patient needs an appointment for further refills.

## 2016-02-03 ENCOUNTER — Other Ambulatory Visit: Payer: Self-pay | Admitting: Family Medicine

## 2016-02-05 NOTE — Telephone Encounter (Signed)
Patient needs to schedule an appointment for further refills °

## 2016-03-12 ENCOUNTER — Ambulatory Visit (INDEPENDENT_AMBULATORY_CARE_PROVIDER_SITE_OTHER): Payer: BC Managed Care – PPO | Admitting: Adult Health

## 2016-03-12 VITALS — BP 138/70 | Temp 98.3°F | Ht 73.0 in | Wt 248.6 lb

## 2016-03-12 DIAGNOSIS — J0100 Acute maxillary sinusitis, unspecified: Secondary | ICD-10-CM

## 2016-03-12 MED ORDER — DOXYCYCLINE HYCLATE 100 MG PO CAPS: 100.0000 mg | ORAL_CAPSULE | Freq: Two times a day (BID) | ORAL | 0 refills | Status: DC

## 2016-03-12 NOTE — Progress Notes (Signed)
Subjective:    Patient ID: HEZIKIAH WOODHAMS, male    DOB: [birth year], 71 y.o.   MRN: [id number]  Sinusitis  This is a new problem. The current episode started in the past 7 days. There has been no fever. Associated symptoms include chills, congestion, coughing and sinus pressure. Pertinent negatives include no diaphoresis or shortness of breath. Past treatments include acetaminophen (benadryl ). The treatment provided mild relief.   Review of Systems  Constitutional: Positive for chills and fatigue. Negative for activity change, diaphoresis and fever.  HENT: Positive for congestion, postnasal drip, rhinorrhea, sinus pain and sinus pressure.   Respiratory: Positive for cough, chest tightness and wheezing. Negative for shortness of breath.   Cardiovascular: Negative.    Past Medical History:  Diagnosis Date  . Hypertension   . Hypothyroidism     Social History   Social History  . Marital status: Married    Spouse name: N/A  . Number of children: N/A  . Years of education: N/A   Occupational History  . Not on file.   Social History Main Topics  . Smoking status: Never Smoker  . Smokeless tobacco: Not on file  . Alcohol use No  . Drug use: No  . Sexual activity: Not on file   Other Topics Concern  . Not on file   Social History Narrative   Married. Daughter 52 in 2016. 5 granddaughters- 2 are stepdaughters.       Works at [employer] in [employer]      Hobbies: travel when able, time with grandkids    Past Surgical History:  Procedure Laterality Date  . THYROIDECTOMY     "cold spot", ultimately benign    Family History  Problem Relation Age of Onset  . Breast cancer Mother        . Hypertension Mother   . Hypertension Brother   . Seizures Father     past age 48    No Known Allergies  Current Outpatient Prescriptions on File Prior to Visit  Medication Sig Dispense Refill  . hydrochlorothiazide (MICROZIDE) 12.5 MG capsule TAKE 1 CAPSULE  (12.5 MG TOTAL) BY MOUTH DAILY. 30 capsule 3  . levothyroxine (SYNTHROID, LEVOTHROID) 200 MCG tablet TAKE 1 TABLET (200 MCG TOTAL) BY MOUTH DAILY BEFORE BREAKFAST. 90 tablet 3  . levothyroxine (SYNTHROID, LEVOTHROID) 25 MCG tablet TAKE 1 TABLET BY MOUTH DAILY. 90 tablet 2  . lisinopril (PRINIVIL,ZESTRIL) 40 MG tablet TAKE 1 TABLET BY MOUTH DAILY. 30 tablet 1   No current facility-administered medications on file prior to visit.     BP 138/70   Temp 98.3 F (36.8 C) (Oral)   Ht 6\' 1"  (1.854 m)   Wt 248 lb 9.6 oz (112.8 kg)   BMI 32.80 kg/m       Objective:   Physical Exam  Constitutional: He is oriented to person, place, and time. He appears well-developed and well-nourished. No distress.  HENT:  Head: Normocephalic and atraumatic.  Right Ear: External ear normal.  Left Ear: External ear normal.  Nose: Nose normal.  Mouth/Throat: Oropharynx is clear and moist. No oropharyngeal exudate.  Eyes: Conjunctivae and EOM are normal. Pupils are equal, round, and reactive to light. Right eye exhibits no discharge. Left eye exhibits no discharge. No scleral icterus.  Neck: Normal range of motion. Neck supple. No thyromegaly present.  Cardiovascular: Normal rate, regular rhythm, normal heart sounds and intact distal pulses.  Exam reveals no gallop and no friction rub.  No murmur heard. Pulmonary/Chest: Effort normal. He has wheezes (trace in bilateral upper. ).  Lymphadenopathy:    He has no cervical adenopathy.  Neurological: He is alert and oriented to person, place, and time. He has normal reflexes. He displays normal reflexes. No cranial nerve deficit. He exhibits normal muscle tone. Coordination normal.  Skin: Skin is warm and dry. No rash noted. He is not diaphoretic. No erythema. No pallor.  Psychiatric: He has a normal mood and affect. His behavior is normal. Judgment and thought content normal.  Nursing note and vitals reviewed.     Assessment & Plan:  1. Acute non-recurrent  maxillary sinusitis - doxycycline (VIBRAMYCIN) 100 MG capsule; Take 1 capsule (100 mg total) by mouth 2 (two) times daily.  Dispense: 14 capsule; Refill: 0    Patient had syncopal event while in the office. When getting off the exam table he appeared to have a vasovagal response and fainted. He was caught by this Probation officer and placed into chair. He woke up 10 seconds after event. BP 90/70 on recheck. He was given PO fluids and his blood pressure came back up to 110/98. He felt improved and his wife drove him home  Advised to increase fluid consumption dramatically.   Dorothyann Peng, NP

## 2016-04-07 ENCOUNTER — Other Ambulatory Visit: Payer: Self-pay | Admitting: Family Medicine

## 2016-05-13 ENCOUNTER — Other Ambulatory Visit: Payer: Self-pay | Admitting: Family Medicine

## 2016-05-17 ENCOUNTER — Other Ambulatory Visit: Payer: Self-pay | Admitting: Family Medicine

## 2016-05-23 ENCOUNTER — Other Ambulatory Visit: Payer: Self-pay | Admitting: Family Medicine

## 2016-10-17 ENCOUNTER — Other Ambulatory Visit: Payer: Self-pay | Admitting: Family Medicine

## 2016-12-09 ENCOUNTER — Telehealth: Payer: Self-pay | Admitting: Family Medicine

## 2016-12-09 ENCOUNTER — Other Ambulatory Visit: Payer: Self-pay

## 2016-12-09 MED ORDER — HYDROCHLOROTHIAZIDE 12.5 MG PO CAPS: ORAL_CAPSULE | ORAL | 0 refills | Status: DC

## 2016-12-09 NOTE — Telephone Encounter (Signed)
MEDICATION: hydrochlorothiazide (MICROZIDE) 12.5 MG capsule   PHARMACY:  [id number] [address] [place]  IS THIS A 90 DAY SUPPLY : no  IS PATIENT OUT OF MEDICATION: yes  IF NOT; HOW MUCH IS LEFT: 0  LAST APPOINTMENT DATE: @01 /12/2015  NEXT APPOINTMENT DATE:N/A  OTHER COMMENTS: Patient wants a 90 day supply if possible. Going out of town Wednesday. If it won't be filled by then please call to arrange sending it to a pharmacy near him.   **Let patient know to contact pharmacy at the end of the day to make sure medication is ready. **  ** Please notify patient to allow 48-72 hours to process**  **Encourage patient to contact the pharmacy for refills or they can request refills through [facility]**

## 2016-12-09 NOTE — Telephone Encounter (Signed)
Refill sent to patients pharmacy. 

## 2017-01-15 ENCOUNTER — Other Ambulatory Visit: Payer: Self-pay

## 2017-01-15 MED ORDER — LISINOPRIL 40 MG PO TABS: ORAL_TABLET | ORAL | 5 refills | Status: DC

## 2017-01-30 ENCOUNTER — Other Ambulatory Visit: Payer: Self-pay

## 2017-01-30 MED ORDER — LEVOTHYROXINE SODIUM 200 MCG PO TABS: ORAL_TABLET | ORAL | 0 refills | Status: DC

## 2017-01-31 ENCOUNTER — Other Ambulatory Visit: Payer: Self-pay | Admitting: Family Medicine

## 2017-01-31 MED ORDER — LISINOPRIL 40 MG PO TABS: ORAL_TABLET | ORAL | 1 refills | Status: DC

## 2017-01-31 NOTE — Telephone Encounter (Signed)
Copied from [place] [id number]. Topic: Quick Communication - Rx Refill/Question >> Jan 31, 2017  2:31 PM Lolita Rieger, [place] wrote: Medication: lisinopril 40 mg   Has the patient contacted their pharmacy? no   (Agent: If no, request that the patient contact the pharmacy for the refill.)   Preferred Pharmacy (with phone number or street name):[facility] [address] [facility] [place]: Please be advised that RX refills may take up to 3 business days. We ask that you follow-up with your pharmacy. Pt has appointment scheduled for end of the month

## 2017-01-31 NOTE — Telephone Encounter (Signed)
Copied from [place] [id number]. Topic: Quick Communication - Rx Refill/Question >> Jan 31, 2017  2:31 PM Lolita Rieger, [place] wrote: Medication: lisinopril 40 mg   Has the patient contacted their pharmacy? no   (Agent: If no, request that the patient contact the pharmacy for the refill.)   Preferred Pharmacy (with phone number or street name):[facility] [address] [facility] [place]: Please be advised that RX refills may take up to 3 business days. We ask that you follow-up with your pharmacy.

## 2017-01-31 NOTE — Telephone Encounter (Signed)
See note

## 2017-01-31 NOTE — Telephone Encounter (Signed)
Rx refill sent to pharmacy. 

## 2017-02-19 ENCOUNTER — Ambulatory Visit: Payer: BC Managed Care – PPO | Admitting: Family Medicine

## 2017-02-21 ENCOUNTER — Other Ambulatory Visit: Payer: Self-pay

## 2017-02-21 MED ORDER — LEVOTHYROXINE SODIUM 25 MCG PO TABS: 25.0000 ug | ORAL_TABLET | Freq: Every day | ORAL | 2 refills | Status: DC

## 2017-02-28 ENCOUNTER — Encounter: Payer: Self-pay | Admitting: Internal Medicine

## 2017-02-28 ENCOUNTER — Ambulatory Visit (INDEPENDENT_AMBULATORY_CARE_PROVIDER_SITE_OTHER): Payer: BC Managed Care – PPO | Admitting: Family Medicine

## 2017-02-28 ENCOUNTER — Encounter: Payer: Self-pay | Admitting: Family Medicine

## 2017-02-28 VITALS — BP 118/78 | HR 58 | Temp 98.3°F | Ht 73.0 in | Wt 260.0 lb

## 2017-02-28 DIAGNOSIS — E039 Hypothyroidism, unspecified: Secondary | ICD-10-CM

## 2017-02-28 DIAGNOSIS — Z1211 Encounter for screening for malignant neoplasm of colon: Secondary | ICD-10-CM

## 2017-02-28 DIAGNOSIS — Z23 Encounter for immunization: Secondary | ICD-10-CM | POA: Diagnosis not present

## 2017-02-28 DIAGNOSIS — E669 Obesity, unspecified: Secondary | ICD-10-CM | POA: Diagnosis not present

## 2017-02-28 DIAGNOSIS — Z1159 Encounter for screening for other viral diseases: Secondary | ICD-10-CM

## 2017-02-28 DIAGNOSIS — I10 Essential (primary) hypertension: Secondary | ICD-10-CM | POA: Diagnosis not present

## 2017-02-28 LAB — COMPREHENSIVE METABOLIC PANEL
ALBUMIN: 4.4 g/dL (ref 3.5–5.2)
ALK PHOS: 38 U/L — AB (ref 39–117)
ALT: 49 U/L (ref 0–53)
AST: 42 U/L — AB (ref 0–37)
BUN: 14 mg/dL (ref 6–23)
CO2: 29 mEq/L (ref 19–32)
CREATININE: 0.89 mg/dL (ref 0.40–1.50)
Calcium: 9 mg/dL (ref 8.4–10.5)
Chloride: 104 mEq/L (ref 96–112)
GFR: 89.96 mL/min (ref >60.00)
GLUCOSE: 99 mg/dL (ref 70–99)
POTASSIUM: 3.7 meq/L (ref 3.5–5.1)
SODIUM: 139 meq/L (ref 135–145)
TOTAL PROTEIN: 7.1 g/dL (ref 6.0–8.3)
Total Bilirubin: 0.9 mg/dL (ref 0.2–1.2)

## 2017-02-28 LAB — TSH: TSH: 3.22 u[IU]/mL (ref 0.35–4.50)

## 2017-02-28 MED ORDER — ALUMINUM CHLORIDE 20 % EX SOLN: Freq: Every day | CUTANEOUS | 2 refills | Status: DC | PRN

## 2017-02-28 MED ORDER — HYDROCHLOROTHIAZIDE 12.5 MG PO TABS: 12.5000 mg | ORAL_TABLET | Freq: Every day | ORAL | 1 refills | Status: DC

## 2017-02-28 MED ORDER — LISINOPRIL 40 MG PO TABS: ORAL_TABLET | ORAL | 1 refills | Status: DC

## 2017-02-28 MED ORDER — LEVOTHYROXINE SODIUM 200 MCG PO TABS: ORAL_TABLET | ORAL | 1 refills | Status: DC

## 2017-02-28 MED ORDER — LEVOTHYROXINE SODIUM 25 MCG PO TABS: 12.5000 ug | ORAL_TABLET | Freq: Every day | ORAL | 1 refills | Status: DC

## 2017-02-28 NOTE — Progress Notes (Signed)
Subjective:  Darrell Griffin is a 72 y.o. year old very pleasant male patient who presents for/with See problem oriented charting ROS- No chest pain or shortness of breath. No headache or blurry vision. Has had some weight gain but admits to poor dietary choices   Past Medical History-  Patient Active Problem List   Diagnosis Date Noted  . Hypothyroidism 10/22/2005    Priority: Medium  . Essential hypertension 10/22/2005    Priority: Medium  . Obesity, Class I, BMI 30-34.9 12/25/2011    Priority: Low    Medications- reviewed and updated Current Outpatient Medications  Medication Sig Dispense Refill  . levothyroxine (SYNTHROID, LEVOTHROID) 200 MCG tablet TAKE 1 TABLET (200 MCG TOTAL) BY MOUTH DAILY BEFORE BREAKFAST. 90 tablet 1  . levothyroxine (SYNTHROID, LEVOTHROID) 25 MCG tablet Take 0.5 tablets (12.5 mcg total) by mouth daily. 90 tablet 1  . lisinopril (PRINIVIL,ZESTRIL) 40 MG tablet TAKE 1 TABLET BY MOUTH DAILY 90 tablet 1  . aluminum chloride (DRYSOL) 20 % external solution Apply topically daily as needed. For underarm perspiration 60 mL 2  . hydrochlorothiazide (HYDRODIURIL) 12.5 MG tablet Take 1 tablet (12.5 mg total) by mouth daily. 90 tablet 1   No current facility-administered medications for this visit.     Objective: BP 118/78 (BP Location: Left Arm, Patient Position: Sitting, Cuff Size: Large)   Pulse (!) 58   Temp 98.3 F (36.8 C) (Oral)   Ht 6\' 1"  (1.854 m)   Wt 260 lb (117.9 kg)   SpO2 97%   BMI 34.30 kg/m  Gen: NAD, resting comfortably No obvious thyromegaly CV: RRR no murmurs rubs or gallops Lungs: CTAB no crackles, wheeze, rhonchi Abdomen: soft/nontender/nondistended/normal bowel sounds. obese Ext: no edema Skin: warm, dry  Assessment/Plan:  Hypothyroidism S: Lab Results  Component Value Date   TSH 3.14 06/29/2014   On thyroid medication-levothyroxine 212.79mcg (200 mcg plus half tablet of 25 mcg) A/P: update tsh  Essential hypertension S:  controlled on lisinopril 40mg  and hctz 12.5mg   BP Readings from Last 3 Encounters:  02/28/17 118/78  03/12/16 138/70  02/02/15 130/70  A/P: We discussed blood pressure goal of <140/90. Continue current meds. Work on getting weight back under 250  Obesity, Class I, BMI 30-34.9 S: weight trending up A/P: Encouraged need for healthy eating, regular exercise, weight loss. Target weight under 250 by physical   Lab/Order associations: influenza vaccination and pneumovax 23 also given Hypothyroidism, unspecified type - Plan: TSH  Essential hypertension - Plan: Comprehensive metabolic panel, CBC, CANCELED: CBC  Obesity, Class I, BMI 30-34.9  Screen for colon cancer - Plan: Ambulatory referral to Gastroenterology  Encounter for HCV screening test for low risk patient - Plan: Hepatitis C antibody  Meds ordered this encounter  Medications  . aluminum chloride (DRYSOL) 20 % external solution    Sig: Apply topically daily as needed. For underarm perspiration    Dispense:  60 mL    Refill:  2  . hydrochlorothiazide (HYDRODIURIL) 12.5 MG tablet    Sig: Take 1 tablet (12.5 mg total) by mouth daily.    Dispense:  90 tablet    Refill:  1  . levothyroxine (SYNTHROID, LEVOTHROID) 200 MCG tablet    Sig: TAKE 1 TABLET (200 MCG TOTAL) BY MOUTH DAILY BEFORE BREAKFAST.    Dispense:  90 tablet    Refill:  1  . levothyroxine (SYNTHROID, LEVOTHROID) 25 MCG tablet    Sig: Take 0.5 tablets (12.5 mcg total) by mouth daily.    Dispense:  90 tablet    Refill:  1  . lisinopril (PRINIVIL,ZESTRIL) 40 MG tablet    Sig: TAKE 1 TABLET BY MOUTH DAILY    Dispense:  90 tablet    Refill:  1    Return precautions advised.  Garret Reddish, MD

## 2017-02-28 NOTE — Addendum Note (Signed)
Addended by: Mariam Dollar, Roselyn Reef M on: 02/28/2017 01:41 PM   Modules accepted: Orders

## 2017-02-28 NOTE — Assessment & Plan Note (Signed)
S: controlled on lisinopril 40mg  and hctz 12.5mg   BP Readings from Last 3 Encounters:  02/28/17 118/78  03/12/16 138/70  02/02/15 130/70  A/P: We discussed blood pressure goal of <140/90. Continue current meds. Work on getting weight back under 250

## 2017-02-28 NOTE — Patient Instructions (Addendum)
3-6 month physical  We will call you within a week or two about your referral to [facility] for colonoscopy. If you do not hear within 3 weeks, give [place] a call.   Flu shot and pneumovax 23 today before you go   Please stop by lab before you go

## 2017-02-28 NOTE — Assessment & Plan Note (Signed)
S: Lab Results  Component Value Date   TSH 3.14 06/29/2014   On thyroid medication-levothyroxine 212.52mcg (200 mcg plus half tablet of 25 mcg) A/P: update tsh

## 2017-02-28 NOTE — Assessment & Plan Note (Signed)
S: weight trending up A/P: Encouraged need for healthy eating, regular exercise, weight loss. Target weight under 250 by physical

## 2017-03-01 LAB — CBC
HCT: 46.3 % (ref 38.5–50.0)
HEMOGLOBIN: 15.7 g/dL (ref 13.2–17.1)
MCH: 28.2 pg (ref 27.0–33.0)
MCHC: 33.9 g/dL (ref 32.0–36.0)
MCV: 83.1 fL (ref 80.0–100.0)
MPV: 10.9 fL (ref 7.5–12.5)
PLATELETS: 204 10*3/uL (ref 140–400)
RBC: 5.57 10*6/uL (ref 4.20–5.80)
RDW: 12.8 % (ref 11.0–15.0)
WBC: 8 10*3/uL (ref 3.8–10.8)

## 2017-03-01 LAB — HEPATITIS C ANTIBODY
Hepatitis C Ab: NONREACTIVE
SIGNAL TO CUT-OFF: 0.01 (ref <1.00)

## 2017-03-14 ENCOUNTER — Other Ambulatory Visit: Payer: Self-pay

## 2017-03-14 MED ORDER — ALUMINUM CHLORIDE 20 % EX SOLN: Freq: Every day | CUTANEOUS | 2 refills | Status: DC | PRN

## 2017-03-27 ENCOUNTER — Encounter: Payer: Self-pay | Admitting: *Deleted

## 2017-04-28 ENCOUNTER — Encounter: Payer: BC Managed Care – PPO | Admitting: Internal Medicine

## 2017-05-13 ENCOUNTER — Encounter: Payer: Self-pay | Admitting: Family Medicine

## 2017-06-02 ENCOUNTER — Encounter: Payer: BC Managed Care – PPO | Admitting: Family Medicine

## 2017-06-02 DIAGNOSIS — Z0289 Encounter for other administrative examinations: Secondary | ICD-10-CM

## 2017-06-02 NOTE — Progress Notes (Deleted)
***If fasting- under hypothyroidism unspecified- TSH ***Under hypertension-CBC, CMP, lipid panel ***PSA under screening for prostate cancer  ***Please ask him what happened with GI referral from last visit- would you be okay with [place] placing another referral at this time?   Phone: [phone]  Subjective:  Patient presents today for their annual physical. Chief complaint-noted.   See problem oriented charting- ROS- full  review of systems was completed and negative except for: ***  The following were reviewed and entered/updated in epic: Past Medical History:  Diagnosis Date  . Hypertension   . Hypothyroidism    Patient Active Problem List   Diagnosis Date Noted  . Hypothyroidism 10/22/2005    Priority: Medium  . Essential hypertension 10/22/2005    Priority: Medium  . Obesity, Class I, BMI 30-34.9 12/25/2011    Priority: Low   Past Surgical History:  Procedure Laterality Date  . THYROIDECTOMY     "cold spot", ultimately benign    Family History  Problem Relation Age of Onset  . Seizures Father        past age 18  . Breast cancer Mother           . Hypertension Mother   . Hypertension Brother     Medications- reviewed and updated Current Outpatient Medications  Medication Sig Dispense Refill  . aluminum chloride (DRYSOL) 20 % external solution Apply topically daily as needed. For underarm perspiration 60 mL 2  . hydrochlorothiazide (HYDRODIURIL) 12.5 MG tablet Take 1 tablet (12.5 mg total) by mouth daily. 90 tablet 1  . levothyroxine (SYNTHROID, LEVOTHROID) 200 MCG tablet TAKE 1 TABLET (200 MCG TOTAL) BY MOUTH DAILY BEFORE BREAKFAST. 90 tablet 1  . levothyroxine (SYNTHROID, LEVOTHROID) 25 MCG tablet Take 0.5 tablets (12.5 mcg total) by mouth daily. 90 tablet 1  . lisinopril (PRINIVIL,ZESTRIL) 40 MG tablet TAKE 1 TABLET BY MOUTH DAILY 90 tablet 1   No current facility-administered medications for this visit.     Allergies-reviewed and updated No Known  Allergies  Social History   Social History Narrative   Married. Daughter 60 in 2016. 5 granddaughters- 2 are stepdaughters.       Works at [employer] in [employer]      Hobbies: travel when able, time with grandkids    Objective: There were no vitals taken for this visit. Gen: NAD, resting comfortably HEENT: Mucous membranes are moist. Oropharynx normal Neck: no thyromegaly CV: RRR no murmurs rubs or gallops Lungs: CTAB no crackles, wheeze, rhonchi Abdomen: soft/nontender/nondistended/normal bowel sounds. No rebound or guarding.  Ext: no edema Skin: warm, dry Neuro: grossly normal, moves all extremities, PERRLA ***  Assessment/Plan:  72 y.o. male presenting for annual physical.  Health Maintenance counseling: 1. Anticipatory guidance: Patient counseled regarding regular dental exams ***q6 months, eye exams ***yearly, wearing seatbelts.  2. Risk factor reduction:  Advised patient of need for regular exercise and diet rich and fruits and vegetables to reduce risk of heart attack and stroke. Exercise- ***. Diet-***.  He set a goal last time to work to getting back under 250 at least Wt Readings from Last 3 Encounters:  02/28/17 260 lb (117.9 kg)  03/12/16 248 lb 9.6 oz (112.8 kg)  02/02/15 266 lb (120.7 kg)  3. Immunizations/screenings/ancillary studies-we discussed Shingrix***, otherwise up-to-date Immunization History  Administered Date(s) Administered  . Influenza Split 12/25/2011  . Influenza, High Dose Seasonal PF 02/28/2017  . Pneumococcal Conjugate-13 06/24/2014  . Pneumococcal Polysaccharide-23 02/28/2017  . Td 10/14/2008  . Zoster 12/25/2011  4. Prostate cancer screening- ***we will trend PSA today.  Some BPH on exam Lab Results  Component Value Date   PSA 3.19 06/29/2014   PSA 3.21 12/25/2011   PSA 2.71 10/14/2008   5. Colon cancer screening - ***we referred him to GI last visit.  He canceled with them.  We will refer back today***  6. Skin  cancer screening- ***advised regular sunscreen use. Denies worrisome, changing, or new skin lesions.  7.  Never smoker  Status of chronic or acute concerns  *** Hypothyroidism-has been controlled on 200 mcg levothyroxine plus extra 12.5 mcg/day for total of 2 to 12.5 mcg.  We will update TSH  Hypertension-has been controlled on lisinopril 40 mg and hydrochlorothiazide 12.5 mg.  No problem-specific Assessment & Plan notes found for this encounter.   Future Appointments  Date Time Provider [place]  06/02/2017  9:30 AM Marin Olp, MD [facility]   No follow-ups on file.  Lab/Order associations: No diagnosis found.  No orders of the defined types were placed in this encounter.   Return precautions advised.  Garret Reddish, MD

## 2017-06-04 ENCOUNTER — Encounter: Payer: Self-pay | Admitting: Family Medicine

## 2017-07-29 ENCOUNTER — Other Ambulatory Visit: Payer: Self-pay | Admitting: Family Medicine

## 2017-09-19 ENCOUNTER — Other Ambulatory Visit: Payer: Self-pay | Admitting: Family Medicine

## 2017-09-21 ENCOUNTER — Other Ambulatory Visit: Payer: Self-pay | Admitting: Family Medicine

## 2017-11-05 ENCOUNTER — Other Ambulatory Visit: Payer: Self-pay | Admitting: Family Medicine

## 2017-11-16 ENCOUNTER — Other Ambulatory Visit: Payer: Self-pay | Admitting: Family Medicine

## 2017-12-26 ENCOUNTER — Encounter: Payer: Self-pay | Admitting: Family Medicine

## 2017-12-26 ENCOUNTER — Ambulatory Visit: Payer: BC Managed Care – PPO | Admitting: Family Medicine

## 2017-12-26 VITALS — BP 138/72 | HR 57 | Temp 98.5°F | Ht 73.0 in | Wt 254.4 lb

## 2017-12-26 DIAGNOSIS — I10 Essential (primary) hypertension: Secondary | ICD-10-CM | POA: Diagnosis not present

## 2017-12-26 DIAGNOSIS — J329 Chronic sinusitis, unspecified: Secondary | ICD-10-CM

## 2017-12-26 DIAGNOSIS — B9689 Other specified bacterial agents as the cause of diseases classified elsewhere: Secondary | ICD-10-CM

## 2017-12-26 MED ORDER — AMOXICILLIN-POT CLAVULANATE 875-125 MG PO TABS: 1.0000 | ORAL_TABLET | Freq: Two times a day (BID) | ORAL | 0 refills | Status: AC

## 2017-12-26 NOTE — Patient Instructions (Addendum)
Health Maintenance Due  Topic Date Due  . COLONOSCOPY -check with your insurance about cologuard since you dont seem so thrilled about colonoscopy! Would love to have one or the other done. If this colonoscopy was clear- you would be done for life. Let me know what you decide- happy to set you up with either 09/15/1997  . INFLUENZA VACCINE - consider nurse visit when no longer ill 08/21/2017   Bacterial sinus infection/Sinsusitis Bacterial based on: Symptoms >10 days, double sickening  Treatment: -considered steroid: we opted out for now with blood pressure high normal- could consider if the antibiotic doesn't clear symptoms -other symptomatic care with mucinex- DM. He is currently on a mucinex that has phenylephrine which is a decongestant and may raise his blood pressure- if he opts to continue this should check his blood pressure at home to make sure <140/90 -Antibiotic indicated: yes  Finally, we reviewed reasons to return to care including if symptoms worsen or persist or new concerns arise (particularly fever or shortness of breath)  Meds ordered this encounter  Medications  . amoxicillin-clavulanate (AUGMENTIN) 875-125 MG tablet    Sig: Take 1 tablet by mouth 2 (two) times daily for 7 days.    Dispense:  14 tablet    Refill:  0

## 2017-12-26 NOTE — Assessment & Plan Note (Signed)
S: controlled on Lisinopril 40mg , hctz 71.0 mg but systolic is high normal on decongestant BP Readings from Last 3 Encounters:  12/26/17 138/72  02/28/17 118/78  03/12/16 138/70  A/P: We discussed blood pressure goal of <140/90. Continue current meds: I encouraged him to stop his Mucinex with phenylephrine-encouraged him to take Mucinex DM instead- if he does continue Mucinex fast max with decongestant-he needs to monitor his blood pressure at home

## 2017-12-26 NOTE — Progress Notes (Signed)
PCP: Marin Olp, MD  Subjective:  Darrell Griffin is a 72 y.o. year old very pleasant male patient who presents with sinusitis symptoms including nasal congestion, cough -other symptoms include: Cough productive but clear to greenish- was dry at first Minimal runny nose  -day of illness:11 days -Symptoms are worsening-but previously had been improving -previous treatments: mucinex fast max severe congestion and cough which unfortunately includes a decongestant -sick contacts/travel/risks: denies flu exposure.  -Hx of: allergies  ROS-denies fever, SOB, NVD, tooth pain.  Denies chest congestion.  No sore throat. No ear pressure pain.   Pertinent Past Medical History-  Patient Active Problem List   Diagnosis Date Noted  . Hypothyroidism 10/22/2005    Priority: Medium  . Essential hypertension 10/22/2005    Priority: Medium  . Obesity, Class I, BMI 30-34.9 12/25/2011    Priority: Low    Medications- reviewed  Current Outpatient Medications  Medication Sig Dispense Refill  . aluminum chloride (DRYSOL) 20 % external solution Apply topically daily as needed. For underarm perspiration 60 mL 2  . hydrochlorothiazide (MICROZIDE) 12.5 MG capsule TAKE 1 CAPSULE BY MOUTH EVERY DAY 90 capsule 1  . levothyroxine (SYNTHROID, LEVOTHROID) 200 MCG tablet TAKE 1 TABLET (200 MCG TOTAL) BY MOUTH DAILY BEFORE BREAKFAST. Office visit before next refill 90 tablet 0  . levothyroxine (SYNTHROID, LEVOTHROID) 25 MCG tablet Take 0.5 tablets (12.5 mcg total) by mouth daily. 90 tablet 1  . lisinopril (PRINIVIL,ZESTRIL) 40 MG tablet TAKE 1 TABLET BY MOUTH DAILY 90 tablet 1   No current facility-administered medications for this visit.     Objective: BP 138/72 (BP Location: Left Arm, Patient Position: Sitting, Cuff Size: Large)   Pulse (!) 57   Temp 98.5 F (36.9 C) (Oral)   Ht 6\' 1"  (1.854 m)   Wt 254 lb 6.4 oz (115.4 kg)   SpO2 98%   BMI 33.56 kg/m  Gen: NAD, resting comfortably HEENT:  Turbinates erythematous, TM normal, pharynx mildly erythematous with no tonsilar exudate or edema-drainage noted, minimal sinus tenderness CV: RRR no murmurs rubs or gallops Lungs: CTAB no crackles, wheeze, rhonchi Abdomen: soft/nontender/nondistended Ext: no edema Skin: warm, dry, no rash Neuro: grossly normal, moves all extremities  Assessment/Plan:  Bacterial sinus infection/Sinsusitis Bacterial based on: Symptoms >10 days, double sickening  Treatment: -considered steroid: we opted out for now with blood pressure high normal- could consider if the antibiotic doesn't clear symptoms -other symptomatic care with mucinex- DM. He is currently on a mucinex that has phenylephrine which is a decongestant and may raise his blood pressure- if he opts to continue this should check his blood pressure at home to make sure <140/90 -Antibiotic indicated: yes  Finally, we reviewed reasons to return to care including if symptoms worsen or persist or new concerns arise (particularly fever or shortness of breath)   Essential hypertension S: controlled on Lisinopril 40mg , hctz 35.3 mg but systolic is high normal on decongestant BP Readings from Last 3 Encounters:  12/26/17 138/72  02/28/17 118/78  03/12/16 138/70  A/P: We discussed blood pressure goal of <140/90. Continue current meds: I encouraged him to stop his Mucinex with phenylephrine-encouraged him to take Mucinex DM instead- if he does continue Mucinex fast max with decongestant-he needs to monitor his blood pressure at home     Meds ordered this encounter  Medications  . amoxicillin-clavulanate (AUGMENTIN) 875-125 MG tablet    Sig: Take 1 tablet by mouth 2 (two) times daily for 7 days.    Dispense:  14 tablet    Refill:  0   Garret Reddish, MD

## 2018-02-09 ENCOUNTER — Encounter: Payer: Self-pay | Admitting: Emergency Medicine

## 2018-02-09 ENCOUNTER — Other Ambulatory Visit: Payer: Self-pay

## 2018-02-09 DIAGNOSIS — R1031 Right lower quadrant pain: Secondary | ICD-10-CM | POA: Diagnosis present

## 2018-02-09 DIAGNOSIS — N281 Cyst of kidney, acquired: Secondary | ICD-10-CM | POA: Insufficient documentation

## 2018-02-09 DIAGNOSIS — Z79899 Other long term (current) drug therapy: Secondary | ICD-10-CM | POA: Insufficient documentation

## 2018-02-09 DIAGNOSIS — I10 Essential (primary) hypertension: Secondary | ICD-10-CM | POA: Diagnosis not present

## 2018-02-09 DIAGNOSIS — K5900 Constipation, unspecified: Secondary | ICD-10-CM | POA: Insufficient documentation

## 2018-02-09 DIAGNOSIS — E039 Hypothyroidism, unspecified: Secondary | ICD-10-CM | POA: Insufficient documentation

## 2018-02-09 LAB — CBC WITH DIFFERENTIAL/PLATELET
Abs Immature Granulocytes: 0.03 10*3/uL (ref 0.00–0.07)
Basophils Absolute: 0.1 10*3/uL (ref 0.0–0.1)
Basophils Relative: 1 %
Eosinophils Absolute: 0.2 10*3/uL (ref 0.0–0.5)
Eosinophils Relative: 3 %
HCT: 46.8 % (ref 39.0–52.0)
HEMOGLOBIN: 15.6 g/dL (ref 13.0–17.0)
Immature Granulocytes: 0 %
Lymphocytes Relative: 35 %
Lymphs Abs: 2.9 10*3/uL (ref 0.7–4.0)
MCH: 28.8 pg (ref 26.0–34.0)
MCHC: 33.3 g/dL (ref 30.0–36.0)
MCV: 86.3 fL (ref 80.0–100.0)
MONO ABS: 0.7 10*3/uL (ref 0.1–1.0)
MONOS PCT: 8 %
Neutro Abs: 4.4 10*3/uL (ref 1.7–7.7)
Neutrophils Relative %: 53 %
Platelets: 180 10*3/uL (ref 150–400)
RBC: 5.42 MIL/uL (ref 4.22–5.81)
RDW: 12.6 % (ref 11.5–15.5)
WBC: 8.3 10*3/uL (ref 4.0–10.5)
nRBC: 0 % (ref 0.0–0.2)

## 2018-02-09 LAB — URINALYSIS, COMPLETE (UACMP) WITH MICROSCOPIC
Bacteria, UA: NONE SEEN
Bilirubin Urine: NEGATIVE
Glucose, UA: NEGATIVE mg/dL
Hgb urine dipstick: NEGATIVE
KETONES UR: NEGATIVE mg/dL
Leukocytes, UA: NEGATIVE
Nitrite: NEGATIVE
PH: 5 (ref 5.0–8.0)
Protein, ur: NEGATIVE mg/dL
Specific Gravity, Urine: 1.011 (ref 1.005–1.030)
Squamous Epithelial / HPF: NONE SEEN (ref 0–5)

## 2018-02-09 NOTE — ED Triage Notes (Addendum)
Patient ambulatory to triage with steady gait, without difficulty or distress noted; pt reports couple days having right lower abd pain radiating into flank with no accomp symptoms

## 2018-02-10 ENCOUNTER — Emergency Department: Payer: BC Managed Care – PPO

## 2018-02-10 ENCOUNTER — Emergency Department
Admission: EM | Admit: 2018-02-10 | Discharge: 2018-02-10 | Disposition: A | Discharge status: Home / Self Care | Payer: BC Managed Care – PPO | Attending: Emergency Medicine | Admitting: Emergency Medicine

## 2018-02-10 DIAGNOSIS — K5900 Constipation, unspecified: Secondary | ICD-10-CM

## 2018-02-10 DIAGNOSIS — N281 Cyst of kidney, acquired: Secondary | ICD-10-CM

## 2018-02-10 LAB — COMPREHENSIVE METABOLIC PANEL
ALT: 43 U/L (ref 0–44)
AST: 44 U/L — ABNORMAL HIGH (ref 15–41)
Albumin: 4.7 g/dL (ref 3.5–5.0)
Alkaline Phosphatase: 41 U/L (ref 38–126)
Anion gap: 5 (ref 5–15)
BUN: 14 mg/dL (ref 8–23)
CO2: 28 mmol/L (ref 22–32)
Calcium: 8.5 mg/dL — ABNORMAL LOW (ref 8.9–10.3)
Chloride: 107 mmol/L (ref 98–111)
Creatinine, Ser: 1.06 mg/dL (ref 0.61–1.24)
GFR calc Af Amer: >60 mL/min (ref >60)
GFR calc non Af Amer: >60 mL/min (ref >60)
Glucose, Bld: 102 mg/dL — ABNORMAL HIGH (ref 70–99)
POTASSIUM: 3.6 mmol/L (ref 3.5–5.1)
Sodium: 140 mmol/L (ref 135–145)
Total Bilirubin: 0.9 mg/dL (ref 0.3–1.2)
Total Protein: 7 g/dL (ref 6.5–8.1)

## 2018-02-10 LAB — LIPASE, BLOOD: Lipase: 48 U/L (ref 11–51)

## 2018-02-10 NOTE — ED Notes (Signed)
Pt states slight nausea.

## 2018-02-10 NOTE — ED Notes (Signed)
MD at the bedside to discuss CT results.

## 2018-02-10 NOTE — ED Provider Notes (Signed)
[facility] Emergency Department Provider Note  ____________________________________________   First MD Initiated Contact with Patient 02/10/18 0133     (approximate)  I have reviewed the triage vital signs and the nursing notes.   HISTORY  Chief Complaint Abdominal Pain    HPI Darrell Griffin is a 73 y.o. male with a history of hypertension as well as hypothyroidism was presented emergency department with right lower quadrant pain over the past 2 to 3 days.  He describes it as a cramping which is intermittent and a 5 out of 10 at this time.  No nausea, vomiting or diarrhea.  She says that he has a history of kidney stone as well as gallbladder disease in his family.  Is concerned about appendicitis.  Denies any burning with urination.  Denies any blood in his urine.   Past Medical History:  Diagnosis Date  . Hypertension   . Hypothyroidism     Patient Active Problem List   Diagnosis Date Noted  . Obesity, Class I, BMI 30-34.9 12/25/2011  . Hypothyroidism 10/22/2005  . Essential hypertension 10/22/2005    Past Surgical History:  Procedure Laterality Date  . THYROIDECTOMY     "cold spot", ultimately benign    Prior to Admission medications   Medication Sig Start Date End Date Taking? Authorizing Provider  aluminum chloride (DRYSOL) 20 % external solution Apply topically daily as needed. For underarm perspiration 03/14/17   Marin Olp, MD  hydrochlorothiazide (MICROZIDE) 12.5 MG capsule TAKE 1 CAPSULE BY MOUTH EVERY DAY 09/23/17   Marin Olp, MD  levothyroxine (SYNTHROID, LEVOTHROID) 200 MCG tablet TAKE 1 TABLET (200 MCG TOTAL) BY MOUTH DAILY BEFORE BREAKFAST. Office visit before next refill 11/05/17   Marin Olp, MD  levothyroxine (SYNTHROID, LEVOTHROID) 25 MCG tablet Take 0.5 tablets (12.5 mcg total) by mouth daily. 02/28/17   Marin Olp, MD  lisinopril (PRINIVIL,ZESTRIL) 40 MG tablet TAKE 1 TABLET BY MOUTH DAILY 02/28/17    Marin Olp, MD    Allergies Patient has no known allergies.  Family History  Problem Relation Age of Onset  . Seizures Father        past age 50  . Breast cancer Mother           . Hypertension Mother   . Hypertension Brother     Social History Social History   Tobacco Use  . Smoking status: Never Smoker  . Smokeless tobacco: Never Used  Substance Use Topics  . Alcohol use: No    Alcohol/week: 0.0 standard drinks  . Drug use: No    Review of Systems  Constitutional: No fever/chills Eyes: No visual changes. ENT: No sore throat. Cardiovascular: Denies chest pain. Respiratory: Denies shortness of breath. Gastrointestinal: no nausea, no vomiting.  No diarrhea.  No constipation. Genitourinary: Negative for dysuria. Musculoskeletal: Negative for back pain. Skin: Negative for rash. Neurological: Negative for headaches, focal weakness or numbness.   ____________________________________________   PHYSICAL EXAM:  VITAL SIGNS: ED Triage Vitals  Enc Vitals Group     BP 02/09/18 2335 135/67     Pulse Rate 02/09/18 2335 (!) 59     Resp 02/09/18 2335 20     Temp 02/09/18 2335 97.7 F (36.5 C)     Temp Source 02/09/18 2335 Oral     SpO2 02/09/18 2335 98 %     Weight 02/09/18 2329 254 lb (115.2 kg)     Height 02/09/18 2329 6\' 1"  (1.854 m)  Head Circumference --      Peak Flow --      Pain Score 02/09/18 2329 6     Pain Loc --      Pain Edu? --      Excl. in [place]? --     Constitutional: Alert and oriented. Well appearing and in no acute distress. Eyes: Conjunctivae are normal.  Head: Atraumatic. Nose: No congestion/rhinnorhea. Mouth/Throat: Mucous membranes are moist.  Neck: No stridor.   Cardiovascular: Normal rate, regular rhythm. Grossly normal heart sounds.  Good peripheral circulation. Respiratory: Normal respiratory effort.  No retractions. Lungs CTAB. Gastrointestinal: Soft with mild right upper quadrant tenderness to palpation without any  rebound or guarding.  Negative Murphy sign.  Negative for other tenderness to the abdomen. No distention. No CVA tenderness. Musculoskeletal: No lower extremity tenderness nor edema.  No joint effusions. Neurologic:  Normal speech and language. No gross focal neurologic deficits are appreciated. Skin:  Skin is warm, dry and intact. No rash noted. Psychiatric: Mood and affect are normal. Speech and behavior are normal.  ____________________________________________   LABS (all labs ordered are listed, but only abnormal results are displayed)  Labs Reviewed  URINALYSIS, COMPLETE (UACMP) WITH MICROSCOPIC - Abnormal; Notable for the following components:      Result Value   Color, Urine YELLOW (*)    APPearance CLEAR (*)    All other components within normal limits  COMPREHENSIVE METABOLIC PANEL - Abnormal; Notable for the following components:   Glucose, Bld 102 (*)    Calcium 8.5 (*)    AST 44 (*)    All other components within normal limits  CBC WITH DIFFERENTIAL/PLATELET  LIPASE, BLOOD   ____________________________________________  EKG   ____________________________________________  RADIOLOGY  CT renal which is negative for hydronephrosis or ureteral stone.  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  Negative for appendicitis.  Hyperdense nodules in the left mid kidney. ____________________________________________   PROCEDURES  Procedure(s) performed:   Procedures  Critical Care performed:   ____________________________________________   INITIAL IMPRESSION / ASSESSMENT AND PLAN / ED COURSE  Pertinent labs & imaging results that were available during my care of the patient were reviewed by me and considered in my medical decision making (see chart for details).  Differential diagnosis includes, but is not limited to, biliary disease (biliary colic, acute cholecystitis, cholangitis, choledocholithiasis, etc), intrathoracic causes for epigastric abdominal  pain including ACS, gastritis, duodenitis, pancreatitis, small bowel or large bowel obstruction, abdominal aortic aneurysm, hernia, and ulcer(s). As part of my medical decision making, I reviewed the following data within the electronic MEDICAL RECORD NUMBER Notes from prior ED visits  ----------------------------------------- 4:36 AM on 02/10/2018 -----------------------------------------  Patient says that he is aware of left-sided kidney cysts.  Says that he has been concerned because his daughter was recently diagnosed with appendicitis.  Also states that he is having difficulty moving his bowels and had a smaller bowel movement than normal yesterday.  We discussed increasing fiber intake as well as possibly using MiraLAX.  Patient without any acute finding on the CAT scan at this time and will be discharged home.  He is understanding of the diagnosis as well as treatment plan willing to comply. ____________________________________________   FINAL CLINICAL IMPRESSION(S) / ED DIAGNOSES  Kidney cysts.  Constipation.  Abdominal pain.  NEW MEDICATIONS STARTED DURING THIS VISIT:  New Prescriptions   No medications on file     Note:  This document was prepared using Dragon voice recognition software and may include unintentional dictation  errors.     Orbie Pyo, MD 02/10/18 [phone]

## 2018-02-10 NOTE — ED Notes (Signed)
Patient discharged to home per MD order. Patient in stable condition, and deemed medically cleared by ED provider for discharge. Discharge instructions reviewed with patient/family using "Teach Back"; verbalized understanding of medication education and administration, and information about follow-up care. Denies further concerns. ° °

## 2018-02-10 NOTE — ED Notes (Addendum)
Pt to CT no distress noted

## 2018-02-12 ENCOUNTER — Encounter: Payer: Self-pay | Admitting: Family Medicine

## 2018-02-12 ENCOUNTER — Telehealth: Payer: Self-pay | Admitting: Family Medicine

## 2018-02-12 ENCOUNTER — Other Ambulatory Visit: Payer: Self-pay | Admitting: Family Medicine

## 2018-02-12 ENCOUNTER — Ambulatory Visit: Payer: BC Managed Care – PPO | Admitting: Family Medicine

## 2018-02-12 VITALS — BP 120/60 | HR 56 | Temp 98.0°F | Ht 73.0 in | Wt 247.8 lb

## 2018-02-12 DIAGNOSIS — Z1211 Encounter for screening for malignant neoplasm of colon: Secondary | ICD-10-CM

## 2018-02-12 DIAGNOSIS — R109 Unspecified abdominal pain: Secondary | ICD-10-CM | POA: Diagnosis not present

## 2018-02-12 NOTE — Patient Instructions (Addendum)
Health Maintenance Due  Topic Date Due  . COLONOSCOPY- We will call you within two weeks about your referral. If you do not hear within 3 weeks, give [place] a call.   09/15/1997  . INFLUENZA VACCINE-schedule a visit for flu shot once you are feeling better 08/21/2017   It appears you have a fair amount of stool in your right colon 1.  I want you to use 1 capful of MiraLAX mixed with a glass of water daily for the next week- you can hold this if you have very loose stool until bowel movement normalizes. 2.  Lets try IBgard 2 capsules 2-3 times a day for the next week  If you have fever, worsening pain, vomiting-seek care in the emergency room again.  If you continue to have episodes like this by Monday-lets get an ultrasound of the right upper quadrant to evaluate for gallstones-  Lets schedule follow-up in 10 to 14 days to see how you are doing

## 2018-02-12 NOTE — Telephone Encounter (Signed)
I called the patient after speaking with Roselyn Reef and being advised that he would not be able to get seen sooner due to Dr. Yong Channel being full for the morning. I explained this to the patient and suggested that "if his pains worsen to where he could not wait until 1pm then to go to urgent care to see if he could be seen." I told the patient that "we are keeping his 1pm appointment" and he stated "okay".   Patient "thanked me for my call" and asked "why Dr. Yong Channel could not call him personally?", I explained that he was in a room with a patient and so I was calling on the teams behalf so that we could update him on his request as soon as possible. He stated he understood.    Copied from [place] [phone]. Topic: Appointment Scheduling - Scheduling Inquiry for Clinic >> Feb 12, 2018  8:12 AM Margot Ables wrote: Reason for CRM: Pt was in ER 02/09/2018 and has appt scheduled today with Dr. Yong Channel at 1:00pm. Pt requesting call from nurse/Dr. Yong Channel to see about being seen more urgently stating the pain is increasingly worse on right side into his back. ER did CT and labs but stated nothing found. Please advise if pt can be worked in sooner this morning.

## 2018-02-12 NOTE — Progress Notes (Signed)
Subjective:  Darrell Griffin is a 73 y.o. year old very pleasant male patient who presents for/with See problem oriented charting ROS-no fever, chills, vomiting.  Has had some nausea.  Does have right sided abdominal pain radiating into the back.  Past Medical History-  Patient Active Problem List   Diagnosis Date Noted  . Hypothyroidism 10/22/2005    Priority: Medium  . Essential hypertension 10/22/2005    Priority: Medium  . Obesity, Class I, BMI 30-34.9 12/25/2011    Priority: Low    Medications- reviewed and updated Current Outpatient Medications  Medication Sig Dispense Refill  . aluminum chloride (DRYSOL) 20 % external solution Apply topically daily as needed. For underarm perspiration 60 mL 2  . hydrochlorothiazide (MICROZIDE) 12.5 MG capsule TAKE 1 CAPSULE BY MOUTH EVERY DAY 90 capsule 1  . levothyroxine (SYNTHROID, LEVOTHROID) 200 MCG tablet TAKE 1 TABLET (200 MCG TOTAL) BY MOUTH DAILY BEFORE BREAKFAST. OFFICE VISIT BEFORE NEXT REFILL 90 tablet 2  . levothyroxine (SYNTHROID, LEVOTHROID) 25 MCG tablet Take 0.5 tablets (12.5 mcg total) by mouth daily. 90 tablet 1  . lisinopril (PRINIVIL,ZESTRIL) 40 MG tablet TAKE 1 TABLET BY MOUTH DAILY 90 tablet 1   Objective: BP 120/60 (BP Location: Right Arm, Patient Position: Sitting, Cuff Size: Large)   Pulse (!) 56   Temp 98 F (36.7 C) (Oral)   Ht 6\' 1"  (1.854 m)   Wt 247 lb 12.8 oz (112.4 kg)   SpO2 98%   BMI 32.69 kg/m  Gen: NAD, resting comfortably Moist mucous membranes CV: RRR no murmurs rubs or gallops Lungs: CTAB no crackles, wheeze, rhonchi Abdomen: soft/nontender throughout left side- mild tenderness in right lower quadrant-minimal tenderness in right upper quadrant/slightly distended/normal bowel sounds. No rebound or guarding.  Ext: no edema Skin: warm, dry Neuro: Normal speech, normal gait Rectal: normal tone, diffusely enlarged prostate, no masses or tenderness  Assessment/Plan:  Right-sided abdominal pain S:  Patient with abdominal pain for 5 days.  He was recently evaluated in the emergency room for similar issue  He had a CT scan-showed left-sided kidney cyst which were not new to patient-he reports had been reported to him several times in the past.  No acute findings on the CT scan to account for pain though it was a noncontrast exam-did have diverticular disease but no acute inflammation.  No signs of appendicitis.  Also noted to have an enlarged prostate gland.  He did have a fair amount of stool in the right colon on my independent review  He does admit to recently having issues moving his bowels and had a smaller than usual bowel movement day before emergency room visit.  He was advised to increase his fiber intake and start using MiraLAX potentially.  He never started MiraLAX.  He had another severe episode of pain last night for several hours. Feels like severe gas pain in right lower abdomen and into the right lower back but sees to move around some. Feels bloated when it happens. Walking around seems to help as produces belching and that helps. Gas x helped some. Can get pain into RUQ and goes into back. Seems to be worse at nighttime- thinks could be related to fatty meals.   Today notes some intermittent twinges of pain in various areas along right side of abdomen.  He is worried about his gallbladder  Less frequent bowel movements in general- tends to get small sized BMs.  Did have a loose stool yesterday A/P: 73 year old male with right-sided abdominal  pain- stool burden on right side of abdomen on CT scan.  Also complaining of a lot of gaseous distention.  We will trial IBgard - [place] RUQ planned if he does not make improvement within 7 days-he is concerned potentially about his gallbladder - I patient canceled last colonoscopy-wife is with him today and will have him follow through  From AVS "  Patient Instructions   Health Maintenance Due  Topic Date Due  . COLONOSCOPY- We will call  you within two weeks about your referral. If you do not hear within 3 weeks, give [place] a call.   09/15/1997  . INFLUENZA VACCINE-schedule a visit for flu shot once you are feeling better 08/21/2017   It appears you have a fair amount of stool in your right colon 1.  I want you to use 1 capful of MiraLAX mixed with a glass of water daily for the next week- you can hold this if you have very loose stool until bowel movement normalizes. 2.  Lets try IBgard 2 capsules 2-3 times a day for the next week  If you have fever, worsening pain, vomiting-seek care in the emergency room again.  If you continue to have episodes like this by Monday-lets get an ultrasound of the right upper quadrant to evaluate for gallstones-  Lets schedule follow-up in 10 to 14 days to see how you are doing   "  Future Appointments  Date Time Provider [place]  02/26/2018  1:20 PM Marin Olp, MD [facility]   Lab/Order associations: Screen for colon cancer - Plan: Ambulatory referral to Gastroenterology   Return precautions advised.  Garret Reddish, MD

## 2018-02-12 NOTE — Telephone Encounter (Signed)
Noted  

## 2018-02-13 ENCOUNTER — Other Ambulatory Visit: Payer: Self-pay

## 2018-02-13 ENCOUNTER — Telehealth: Payer: Self-pay | Admitting: Family Medicine

## 2018-02-13 DIAGNOSIS — R1011 Right upper quadrant pain: Secondary | ICD-10-CM

## 2018-02-13 NOTE — Telephone Encounter (Signed)
Order placed as requested. Called patient and let him know

## 2018-02-13 NOTE — Telephone Encounter (Signed)
See note  Copied from [place] [id number]. Topic: General - Other >> Feb 13, 2018 11:37 AM Antonieta Iba C wrote: Reason for CRM: pt says in yesterdays visit he was advised/suggested to have a [place]. Pt says that he was hesitant but now would like to have orders placed to have imaging. Pt says if possible he would like to have completed today.   Please assist.

## 2018-02-16 ENCOUNTER — Telehealth: Payer: Self-pay | Admitting: Family Medicine

## 2018-02-16 ENCOUNTER — Ambulatory Visit
Admission: RE | Admit: 2018-02-16 | Discharge: 2018-02-16 | Disposition: A | Discharge status: Home / Self Care | Payer: BC Managed Care – PPO | Source: Ambulatory Visit | Attending: Family Medicine | Admitting: Family Medicine

## 2018-02-16 DIAGNOSIS — R1011 Right upper quadrant pain: Secondary | ICD-10-CM

## 2018-02-16 NOTE — Telephone Encounter (Signed)
Caller states that he is having abdominal pain, he has been following Dr. Ansel Bong instructions. He has developed a rash today. The rash is on his abdomen and his back and is where he is having the pain. It is on the right side only.  Per [employer]

## 2018-02-16 NOTE — Telephone Encounter (Signed)
Spoke to patient this morning and he was diagnosed at [facility] on Saturday with Shingles. Patient was still requesting to go through with Ultrasound today that had been scheduled last week

## 2018-02-17 ENCOUNTER — Telehealth: Payer: Self-pay | Admitting: Family Medicine

## 2018-02-17 NOTE — Telephone Encounter (Signed)
See note  Copied from [place] [id number]. Topic: General - Inquiry >> Feb 17, 2018  2:55 PM Ahmed Prima L wrote: Reason for CRM: pt said he just missed a call from the office and thinks it is regarding his ultra sound, he said please only call house phone [phone]

## 2018-02-18 NOTE — Telephone Encounter (Signed)
Work-up has been reassuring- he could trial Gas-X if he has not tried that or IBgard over-the-counter.  Glad the rash is improving.  I know we had follow-up visit on the sixth of February and we can check in at that time

## 2018-02-18 NOTE — Telephone Encounter (Signed)
Called and advised pt of u/s results. He reports that his condition doesn't seem to have gotten any better, it is relatively unchanged. He reports continued "gas pain" which is briefly alleviated after belching. Sx seem to be worse at night. Rash is still present but not very painful. He would like to know what the next step would be.   Forwarding to Dr. Yong Channel.

## 2018-02-20 ENCOUNTER — Encounter: Payer: Self-pay | Admitting: Gastroenterology

## 2018-02-20 NOTE — Telephone Encounter (Signed)
Tried to call pt no appt scheduled no vm set up. Will try again later.

## 2018-02-24 ENCOUNTER — Ambulatory Visit (INDEPENDENT_AMBULATORY_CARE_PROVIDER_SITE_OTHER): Payer: BC Managed Care – PPO | Admitting: Family Medicine

## 2018-02-24 ENCOUNTER — Encounter: Payer: Self-pay | Admitting: Family Medicine

## 2018-02-24 VITALS — BP 110/62 | HR 85 | Temp 98.2°F | Ht 73.0 in | Wt 239.2 lb

## 2018-02-24 DIAGNOSIS — B029 Zoster without complications: Secondary | ICD-10-CM | POA: Diagnosis not present

## 2018-02-24 DIAGNOSIS — R1010 Upper abdominal pain, unspecified: Secondary | ICD-10-CM | POA: Diagnosis not present

## 2018-02-24 MED ORDER — OMEPRAZOLE 40 MG PO CPDR: 40.0000 mg | DELAYED_RELEASE_CAPSULE | Freq: Every day | ORAL | 3 refills | Status: DC

## 2018-02-24 NOTE — Progress Notes (Signed)
Phone [phone]   Subjective:  Darrell Griffin is a 73 y.o. year old very pleasant male patient who presents for/with See problem oriented charting ROS- continued right sided abdominal pain. No fever or chills. Rash noted- recently diagnosed with shingles. No chest pain reported.   Past Medical History-  Patient Active Problem List   Diagnosis Date Noted  . Hypothyroidism 10/22/2005    Priority: Medium  . Essential hypertension 10/22/2005    Priority: Medium  . Obesity, Class I, BMI 30-34.9 12/25/2011    Priority: Low    Medications- reviewed and updated Current Outpatient Medications  Medication Sig Dispense Refill  . aluminum chloride (DRYSOL) 20 % external solution Apply topically daily as needed. For underarm perspiration 60 mL 2  . hydrochlorothiazide (MICROZIDE) 12.5 MG capsule TAKE 1 CAPSULE BY MOUTH EVERY DAY 90 capsule 1  . levothyroxine (SYNTHROID, LEVOTHROID) 200 MCG tablet TAKE 1 TABLET (200 MCG TOTAL) BY MOUTH DAILY BEFORE BREAKFAST. OFFICE VISIT BEFORE NEXT REFILL 90 tablet 2  . levothyroxine (SYNTHROID, LEVOTHROID) 25 MCG tablet Take 0.5 tablets (12.5 mcg total) by mouth daily. 90 tablet 1  . lisinopril (PRINIVIL,ZESTRIL) 40 MG tablet TAKE 1 TABLET BY MOUTH DAILY 90 tablet 1  . omeprazole (PRILOSEC) 40 MG capsule Take 1 capsule (40 mg total) by mouth daily. 30 capsule 3   No current facility-administered medications for this visit.      Objective:  BP 110/62 (BP Location: Left Arm, Patient Position: Sitting, Cuff Size: Normal)   Pulse 85   Temp 98.2 F (36.8 C) (Oral)   Ht 6\' 1"  (1.854 m)   Wt 239 lb 3.2 oz (108.5 kg)   SpO2 97%   BMI 31.56 kg/m  Gen: NAD, resting comfortably CV: RRR no murmurs rubs or gallops Lungs: CTAB no crackles, wheeze, rhonchi Abdomen: soft/nontender- other than along distribution of shingles rash along right abdomen/nondistended/normal bowel sounds. No rebound or guarding.  Ext: no edema Skin: warm, dry    Assessment and Plan     Shingles Abdominal pain S: patient presented with abdominal pain 02/12/2018. Had been to ER on 02/10/2018 with reassuring CT scan and then discharged. Patient followed up with me 2 days later and we orderd an ultrasound of gallbladder to assess for stones as potential cause of pain (stones found). We also noted fair amount of stool on ct scan and treated with miralax which patient has found helpful for more regular BMs. Tried ibgard and gas x for potential gas cause of pain. Complicating this was then 2 days later patient developed rash along right abdomen and onto right back and diagnosed with shingles- started on valtrex.  Of note-never treated with shingles vaccination  Patient returns today and is Still concerned about gas. Wakes up every night with intense pain along right abdomen- seems to be able to walk it out each time the pain comes- belches or passes gas and seems to improve over about 20 minutes. Gas x and IBGard not helping. Pain in band around the shingles. Not able to go to work due to intermittent pain and poor sleep. Pain happening every night primarily. Days he is drained from night but they are not particularly bad as far as pain  Bowel movements have been better since starting miralax- having one on daily basis- decent size. Still with some mild straining.   Denies burning in chest. Has lost 15 lbs since December- cold and then stomach issues. Not sure if certain foods make it worse. No antireflux medicine at  present.   Taking nothing for pain. Tylenol before bed - not sure it helped. Wants to discuss stronger medication A/P: 73 year old male with abdominal pain along distribution of shingles rash-interestingly pain does not extend to the back-also pain is relieved by burping/belching/walking.  I think the most likely cause of pain is atypical pattern of shingles based pain.  Gas/acid reflux could also be a cause particularly with the pain pattern being worse at night-we will get  H. pylori testing and start him on omeprazole.  He does have upcoming colonoscopy within 2 weeks-we discussed this likely would not resolve his pain but I still thought it was valuable for him to follow through with this.  We did discuss gallstones/gallbladder could be a cause of his pain though I thought less likely the cause.  Would not want to proceed forward with surgical evaluation while he is still dealing with potential shingles as a cause-think we need to give this some more time.  Patient also wants to consider GI referral for endoscopy-we can consider that in the future  He will need FMLA filled out- he will let me know first date out and we will extend at least through his colonoscopy  Future Appointments  Date Time Provider [place]  03/11/2018  9:15 AM Milus Banister, MD [facility]   Lab/Order associations: Upper abdominal pain - Plan: H. pylori breath test, H. pylori breath test  Meds ordered this encounter  Medications  . omeprazole (PRILOSEC) 40 MG capsule    Sig: Take 1 capsule (40 mg total) by mouth daily.    Dispense:  30 capsule    Refill:  3    Time Stamp The duration of face-to-face time during this visit was greater than 25 minutes. Greater than 50% of this time was spent in counseling, explanation of diagnosis, planning of further management, and/or coordination of care including Discussion of possible causes of pain, discussion of possible treatment options, counseling patient given how distressing recent illness has been, discussing time off of work and potential FMLA paperwork-and discussing next steps.    Return precautions advised.  Garret Reddish, MD

## 2018-02-24 NOTE — Patient Instructions (Addendum)
Please stop by lab before you go If you do not have mychart- we will call you about results within 5 business days of [place] receiving them.  If you have mychart- we will send your results within 3 business days of [place] receiving them.  If abnormal or we want to clarify a result, we will call or mychart you to make sure you receive the message.  If you have questions or concerns or don't hear within 5-7 days, please send [place] a message or call us.     I am not strongly suspicious that this is reflux but I do want to see if we can help you with the pain-lets try omeprazole before dinner  I also want you to try Tylenol arthritis/extended release before bed-typically the dose is 1300 mg.  Only take this once per day -If neither of the above help I am willing to call in the tramadol for you to take/trial  And say lets check back in within a week or 2 of your colonoscopy-sooner if you need [place]

## 2018-02-24 NOTE — Telephone Encounter (Signed)
Patient was seen in office today.  

## 2018-02-25 LAB — H. PYLORI BREATH TEST: H. pylori Breath Test: NOT DETECTED

## 2018-02-26 ENCOUNTER — Ambulatory Visit: Payer: BC Managed Care – PPO | Admitting: Family Medicine

## 2018-03-06 ENCOUNTER — Telehealth: Payer: Self-pay | Admitting: Family Medicine

## 2018-03-06 NOTE — Telephone Encounter (Signed)
See note  Copied from [place] #[id number]. Topic: General - Inquiry >> Mar 06, 2018  4:19 PM Virl Axe D wrote: Reason for CRM: Jone Baseman with [employer] called because they cannot tell what the end date of pt's FMLA reads on FMLA form. Would like to speak with someone who can interpret the end date. Please advise. CB# [phone]

## 2018-03-06 NOTE — Telephone Encounter (Signed)
Spoke to [employer] and verbally gave dates. No further action needed.

## 2018-03-11 ENCOUNTER — Encounter: Payer: Self-pay | Admitting: Gastroenterology

## 2018-03-11 ENCOUNTER — Ambulatory Visit: Payer: BC Managed Care – PPO | Admitting: Gastroenterology

## 2018-03-11 VITALS — BP 132/68 | HR 68 | Ht 73.0 in | Wt 239.0 lb

## 2018-03-11 DIAGNOSIS — Z1211 Encounter for screening for malignant neoplasm of colon: Secondary | ICD-10-CM

## 2018-03-11 DIAGNOSIS — R109 Unspecified abdominal pain: Secondary | ICD-10-CM | POA: Diagnosis not present

## 2018-03-11 MED ORDER — PEG 3350-KCL-NA BICARB-NACL 420 G PO SOLR: 4000.0000 mL | ORAL | 0 refills | Status: DC

## 2018-03-11 NOTE — Progress Notes (Signed)
HPI: This is a very pleasant 73 year old man who was referred to me by Marin Olp, MD  to evaluate abdominal pain, routine risk for colon cancer.    Chief complaint is abdominal pain, routine risk for colon cancer  Colonoscopy 2000 Dr. Lajoyce Corners for abdominal pains.  No polyps or cancer.   He has not had colon cancer screening since then that he knows of.  For the past few months he has had  intermittent right sided abd pains, RUQ sometimes radiating to the back. Makes him belch.  Pain improves after 30 min.  Usually occurs after eating.  Pain would get him up at night, tylenol PM has alleviated that.  Around the same time he started to develop a shingles rash on his right flank.  He took 7 days of acyclovir.  The rash is clearing up.  He really does not have skin tenderness.  No fevers or chills.  He has lost about 10 pounds since this all started  Old Data Reviewed: Blood work January 2020 shows normal CBC, essentially normal complete metabolic profile except for very slightly elevated AST at 44, normal lipase. H. pylori breath test February 2020 was negative. Abdominal ultrasound January 2020 shows gallstones in his gallbladder, nondilated bile ducts.  Increased echogenicity of his liver parenchyma.   Review of systems: Pertinent positive and negative review of systems were noted in the above HPI section. All other review negative.   Past Medical History:  Diagnosis Date  . GERD (gastroesophageal reflux disease)   . Hypertension   . Hypothyroidism     Past Surgical History:  Procedure Laterality Date  . THYROIDECTOMY     "cold spot", ultimately benign    Current Outpatient Medications  Medication Sig Dispense Refill  . aluminum chloride (DRYSOL) 20 % external solution Apply topically daily as needed. For underarm perspiration 60 mL 2  . hydrochlorothiazide (MICROZIDE) 12.5 MG capsule TAKE 1 CAPSULE BY MOUTH EVERY DAY 90 capsule 1  . levothyroxine (SYNTHROID, LEVOTHROID)  200 MCG tablet TAKE 1 TABLET (200 MCG TOTAL) BY MOUTH DAILY BEFORE BREAKFAST. OFFICE VISIT BEFORE NEXT REFILL 90 tablet 2  . levothyroxine (SYNTHROID, LEVOTHROID) 25 MCG tablet Take 0.5 tablets (12.5 mcg total) by mouth daily. 90 tablet 1  . lisinopril (PRINIVIL,ZESTRIL) 40 MG tablet TAKE 1 TABLET BY MOUTH DAILY 90 tablet 1  . omeprazole (PRILOSEC) 40 MG capsule Take 1 capsule (40 mg total) by mouth daily. 30 capsule 3   No current facility-administered medications for this visit.     Allergies as of 03/11/2018  . (No Known Allergies)    Family History  Problem Relation Age of Onset  . Seizures Father        past age 44  . Breast cancer Mother           . Hypertension Mother   . Hypertension Brother     Social History   Socioeconomic History  . Marital status: Married    Spouse name: Not on file  . Number of children: Not on file  . Years of education: Not on file  . Highest education level: Not on file  Occupational History  . Not on file  Social Needs  . Financial resource strain: Not on file  . Food insecurity:    Worry: Not on file    Inability: Not on file  . Transportation needs:    Medical: Not on file    Non-medical: Not on file  Tobacco Use  . Smoking status:  Never Smoker  . Smokeless tobacco: Never Used  Substance and Sexual Activity  . Alcohol use: No    Alcohol/week: 0.0 standard drinks  . Drug use: No  . Sexual activity: Not on file  Lifestyle  . Physical activity:    Days per week: Not on file    Minutes per session: Not on file  . Stress: Not on file  Relationships  . Social connections:    Talks on phone: Not on file    Gets together: Not on file    Attends religious service: Not on file    Active member of club or organization: Not on file    Attends meetings of clubs or organizations: Not on file    Relationship status: Not on file  . Intimate partner violence:    Fear of current or ex partner: Not on file    Emotionally abused: Not on  file    Physically abused: Not on file    Forced sexual activity: Not on file  Other Topics Concern  . Not on file  Social History Narrative   Married. Daughter 62 in 2016. 5 granddaughters- 2 are stepdaughters.       Works at [employer] in [employer]      Hobbies: travel when able, time with grandkids     Physical Exam: BP 132/68   Pulse 68   Ht 6\' 1"  (1.854 m)   Wt 239 lb (108.4 kg)   BMI 31.53 kg/m  Constitutional: generally well-appearing Psychiatric: alert and oriented x3 Eyes: extraocular movements intact Mouth: oral pharynx moist, no lesions Neck: supple no lymphadenopathy Cardiovascular: heart regular rate and rhythm Lungs: clear to auscultation bilaterally Abdomen: soft, nontender, nondistended, no obvious ascites, no peritoneal signs, normal bowel sounds Extremities: no lower extremity edema bilaterally Skin: no lesions on visible extremities   Assessment and plan: 73 y.o. male with postprandial right-sided abdominal pains, also shingles rash right side, also routine risk for colon cancer.  As far as his colon cancer screening goes he has not had that in about 20 years I recommended a colonoscopy at his soonest convenience.  He has postprandial right-sided abdominal pains that might be related to recently diagnosed gallstones in his gallbladder.  Certainly biliary colic can present the way he describes.  What is interesting is around the same time that he started having symptoms he developed a shingles rash on his right abdomen and right flank.  To me it is a bit unusual for shingles symptoms to be so postprandial and also so episodic.  I think there is a decent chance that he has 2 problems going on that being shingles and also symptomatic gallstones.  Since his pains are so postprandial I recommended at the same time as his colonoscopy to examine his stomach for peptic ulcer disease, significant gastritis.  I also am going to arrange referral to  [facility] surgery to consider cholecystectomy for possible biliary colic.    Please see the "Patient Instructions" section for addition details about the plan.   Owens Loffler, MD [facility] 03/11/2018, 9:36 AM  Cc: Marin Olp, MD

## 2018-03-11 NOTE — Patient Instructions (Addendum)
You will be set up for a colonoscopy for colon cancer screening. You will be set up for an upper endoscopy post prandial abd pains.  Referral to [facility] surgery for ?symptomatic gallstones.  Thank you for entrusting me with your care and choosing [facility].  Dr Ardis Hughs

## 2018-03-22 ENCOUNTER — Other Ambulatory Visit: Payer: Self-pay | Admitting: Family Medicine

## 2018-03-23 ENCOUNTER — Telehealth: Payer: Self-pay | Admitting: Gastroenterology

## 2018-03-23 NOTE — Telephone Encounter (Signed)
The pt was advised that having a colon could cause hemorrhoid irritation.  He states he will keep the appt because this could be a concern at any time with hemorrhoids and irritation from the prep.

## 2018-03-23 NOTE — Telephone Encounter (Signed)
Spoke with pt he has some concerns about his up coming procd. He stated that he has a hemorrhoid and dnt know if it will hinder the colon or inflame the hemorrhoid causing him a lot of pain after.

## 2018-03-24 ENCOUNTER — Telehealth: Payer: Self-pay | Admitting: Gastroenterology

## 2018-03-24 NOTE — Telephone Encounter (Signed)
Pt is sched for EGD colon 3.4.20.  Pt reported that he "made a mistake in his diet"  And ate a sandwich with lettuce and the bun had sesame seeds.  Please advise.

## 2018-03-24 NOTE — Telephone Encounter (Signed)
Returned pts call.  He ate this sandwich yesterday but has only had clear liquids today.  Advised him that he is okay to proceed with procedure for tomorrow.

## 2018-03-25 ENCOUNTER — Ambulatory Visit (AMBULATORY_SURGERY_CENTER): Payer: BC Managed Care – PPO | Admitting: Gastroenterology

## 2018-03-25 ENCOUNTER — Other Ambulatory Visit: Payer: Self-pay

## 2018-03-25 ENCOUNTER — Encounter: Payer: Self-pay | Admitting: Gastroenterology

## 2018-03-25 VITALS — BP 152/77 | HR 66 | Temp 98.0°F | Resp 22 | Ht 73.0 in | Wt 239.0 lb

## 2018-03-25 DIAGNOSIS — D124 Benign neoplasm of descending colon: Secondary | ICD-10-CM

## 2018-03-25 DIAGNOSIS — D128 Benign neoplasm of rectum: Secondary | ICD-10-CM | POA: Diagnosis not present

## 2018-03-25 DIAGNOSIS — D123 Benign neoplasm of transverse colon: Secondary | ICD-10-CM

## 2018-03-25 DIAGNOSIS — R1011 Right upper quadrant pain: Secondary | ICD-10-CM | POA: Diagnosis not present

## 2018-03-25 DIAGNOSIS — R1013 Epigastric pain: Secondary | ICD-10-CM | POA: Diagnosis not present

## 2018-03-25 DIAGNOSIS — Z1211 Encounter for screening for malignant neoplasm of colon: Secondary | ICD-10-CM | POA: Diagnosis not present

## 2018-03-25 DIAGNOSIS — D129 Benign neoplasm of anus and anal canal: Secondary | ICD-10-CM

## 2018-03-25 DIAGNOSIS — K5730 Diverticulosis of large intestine without perforation or abscess without bleeding: Secondary | ICD-10-CM

## 2018-03-25 DIAGNOSIS — R109 Unspecified abdominal pain: Secondary | ICD-10-CM

## 2018-03-25 DIAGNOSIS — K649 Unspecified hemorrhoids: Secondary | ICD-10-CM

## 2018-03-25 MED ORDER — SODIUM CHLORIDE 0.9 % IV SOLN: 500.0000 mL | Freq: Once | INTRAVENOUS | Status: DC

## 2018-03-25 NOTE — Op Note (Signed)
[place] Patient Name: Darrell Griffin Procedure Date: 03/25/2018 1:30 PM MRN: [id number] Endoscopist: Milus Banister , MD Age: 73 Referring MD:  Date of Birth: [birth year] Gender: Male Account #: [id number] Procedure:                Upper GI endoscopy Indications:              Epigastric abdominal pain, Abdominal pain in the                            right upper quadrant Medicines:                Monitored Anesthesia Care Procedure:                Pre-Anesthesia Assessment:                           - Prior to the procedure, a History and Physical                            was performed, and patient medications and                            allergies were reviewed. The patient's tolerance of                            previous anesthesia was also reviewed. The risks                            and benefits of the procedure and the sedation                            options and risks were discussed with the patient.                            All questions were answered, and informed consent                            was obtained. Prior Anticoagulants: The patient has                            taken no previous anticoagulant or antiplatelet                            agents. ASA Grade Assessment: II - A patient with                            mild systemic disease. After reviewing the risks                            and benefits, the patient was deemed in                            satisfactory condition to undergo the procedure.  After obtaining informed consent, the endoscope was                            passed under direct vision. Throughout the                            procedure, the patient's blood pressure, pulse, and                            oxygen saturations were monitored continuously. The                            Endoscope was introduced through the mouth, and                            advanced to the second part of duodenum.  The upper                            GI endoscopy was accomplished without difficulty.                            The patient tolerated the procedure well. Scope In: Scope Out: Findings:                 The esophagus was normal.                           The stomach was normal.                           The examined duodenum was normal. Complications:            No immediate complications. Estimated blood loss:                            None. Estimated Blood Loss:     Estimated blood loss: none. Impression:               - Normal UGI tract. Recommendation:           - Patient has a contact number available for                            emergencies. The signs and symptoms of potential                            delayed complications were discussed with the                            patient. Return to normal activities tomorrow.                            Written discharge instructions were provided to the                            patient.                           -  Resume previous diet.                           - Continue present medications.                           - It is very likely that the gallstones in your                            gallbladder have been causing your intermittent                            upper abdominal pains. You should continue with                            plans to meet a surgeon next week to discuss the                            symptoms, consider cholecystectomy. Milus Banister, MD 03/25/2018 2:23:16 PM This report has been signed electronically.

## 2018-03-25 NOTE — Op Note (Signed)
[facility] Patient Name: Darrell Griffin Procedure Date: 03/25/2018 1:30 PM MRN: [id number] Endoscopist: Milus Banister , MD Age: 73 Referring MD:  Date of Birth: [birth year] Gender: Male Account #: [id number] Procedure:                Colonoscopy Indications:              Screening for colorectal malignant neoplasm Medicines:                Monitored Anesthesia Care Procedure:                Pre-Anesthesia Assessment:                           - Prior to the procedure, a History and Physical                            was performed, and patient medications and                            allergies were reviewed. The patient's tolerance of                            previous anesthesia was also reviewed. The risks                            and benefits of the procedure and the sedation                            options and risks were discussed with the patient.                            All questions were answered, and informed consent                            was obtained. Prior Anticoagulants: The patient has                            taken no previous anticoagulant or antiplatelet                            agents. ASA Grade Assessment: II - A patient with                            mild systemic disease. After reviewing the risks                            and benefits, the patient was deemed in                            satisfactory condition to undergo the procedure.                           After obtaining informed consent, the colonoscope  was passed under direct vision. Throughout the                            procedure, the patient's blood pressure, pulse, and                            oxygen saturations were monitored continuously. The                            Model CF-HQ190L [id number]) scope was introduced                            through the anus and advanced to the the cecum,                            identified by  appendiceal orifice and ileocecal                            valve. The colonoscopy was performed without                            difficulty. The patient tolerated the procedure                            well. The quality of the bowel preparation was                            good. The ileocecal valve, appendiceal orifice, and                            rectum were photographed. Scope In: 1:49:00 PM Scope Out: 2:09:30 PM Scope Withdrawal Time: 0 hours 17 minutes 7 seconds  Total Procedure Duration: 0 hours 20 minutes 30 seconds  Findings:                 Three sessile polyps were found in the descending                            colon and transverse colon. The polyps were 3 to 6                            mm in size. These polyps were removed with a cold                            snare. Resection and retrieval were complete. jar 1                           A 10 mm polyp was found in the rectum. The polyp                            was pedunculated. The polyp was removed with a hot  snare. Resection and retrieval were complete. jar 2                           Multiple small-mouthed diverticula were found in                            the left colon.                           External and internal hemorrhoids were found. The                            hemorrhoids were small.                           The exam was otherwise without abnormality on                            direct and retroflexion views. Complications:            No immediate complications. Estimated blood loss:                            None. Estimated Blood Loss:     Estimated blood loss: none. Impression:               - Three 3 to 6 mm polyps in the descending colon                            and in the transverse colon, removed with a cold                            snare. Resected and retrieved.                           - One 10 mm polyp in the rectum, removed with a hot                             snare. Resected and retrieved.                           - Diverticulosis in the left colon.                           - External and internal hemorrhoids.                           - The examination was otherwise normal on direct                            and retroflexion views. Recommendation:           - Patient has a contact number available for                            emergencies. The signs and symptoms of potential  delayed complications were discussed with the                            patient. Return to normal activities tomorrow.                            Written discharge instructions were provided to the                            patient.                           - Resume previous diet.                           - Continue present medications.                           You will receive a letter within 2-3 weeks with the                            pathology results and my final recommendations.                           If the polyp(s) is proven to be 'pre-cancerous' on                            pathology, you will need repeat colonoscopy in 3-7                            years. If the polyp(s) is NOT 'precancerous' on                            pathology then you should repeat colon cancer                            screening in 10 years with colonoscopy without need                            for colon cancer screening by any method prior to                            then (including stool testing). Milus Banister, MD 03/25/2018 2:20:51 PM This report has been signed electronically.

## 2018-03-25 NOTE — Progress Notes (Signed)
Report to PACU, RN, vss, BBS= Clear.  

## 2018-03-25 NOTE — Progress Notes (Signed)
Called to room to assist during endoscopic procedure.  Patient ID and intended procedure confirmed with present staff. Received instructions for my participation in the procedure from the performing physician.  

## 2018-03-25 NOTE — Patient Instructions (Signed)
YOU HAD AN ENDOSCOPIC PROCEDURE TODAY AT [facility]:   Refer to the procedure report that was given to you for any specific questions about what was found during the examination.  If the procedure report does not answer your questions, please call your gastroenterologist to clarify.  If you requested that your care partner not be given the details of your procedure findings, then the procedure report has been included in a sealed envelope for you to review at your convenience later.  **Handouts given on Polyps, Diverticulosis and Hemorrhoids**  YOU SHOULD EXPECT: Some feelings of bloating in the abdomen. Passage of more gas than usual.  Walking can help get rid of the air that was put into your GI tract during the procedure and reduce the bloating. If you had a lower endoscopy (such as a colonoscopy or flexible sigmoidoscopy) you may notice spotting of blood in your stool or on the toilet paper. If you underwent a bowel prep for your procedure, you may not have a normal bowel movement for a few days.  Please Note:  You might notice some irritation and congestion in your nose or some drainage.  This is from the oxygen used during your procedure.  There is no need for concern and it should clear up in a day or so.  SYMPTOMS TO REPORT IMMEDIATELY:   Following lower endoscopy (colonoscopy or flexible sigmoidoscopy):  Excessive amounts of blood in the stool  Significant tenderness or worsening of abdominal pains  Swelling of the abdomen that is new, acute  Fever of 100F or higher   Following upper endoscopy (EGD)  Vomiting of blood or coffee ground material  New chest pain or pain under the shoulder blades  Painful or persistently difficult swallowing  New shortness of breath  Fever of 100F or higher  Black, tarry-looking stools  For urgent or emergent issues, a gastroenterologist can be reached at any hour by calling [phone].   DIET:  We do recommend a small meal  at first, but then you may proceed to your regular diet.  Drink plenty of fluids but you should avoid alcoholic beverages for 24 hours.  ACTIVITY:  You should plan to take it easy for the rest of today and you should NOT DRIVE or use heavy machinery until tomorrow (because of the sedation medicines used during the test).    FOLLOW UP: Our staff will call the number listed on your records the next business day following your procedure to check on you and address any questions or concerns that you may have regarding the information given to you following your procedure. If we do not reach you, we will leave a message.  However, if you are feeling well and you are not experiencing any problems, there is no need to return our call.  We will assume that you have returned to your regular daily activities without incident.  If any biopsies were taken you will be contacted by phone or by letter within the next 1-3 weeks.  Please call us at [phone] if you have not heard about the biopsies in 3 weeks.    SIGNATURES/CONFIDENTIALITY: You and/or your care partner have signed paperwork which will be entered into your electronic medical record.  These signatures attest to the fact that that the information above on your After Visit Summary has been reviewed and is understood.  Full responsibility of the confidentiality of this discharge information lies with you and/or your care-partner.

## 2018-03-26 ENCOUNTER — Telehealth: Payer: Self-pay

## 2018-03-26 NOTE — Telephone Encounter (Signed)
  Follow up Call-  Call back number 03/25/2018  Post procedure Call Back phone  # [phone]  Permission to leave phone message Yes  Some recent data might be hidden     Patient questions:  Do you have a fever, pain , or abdominal swelling? No. Pain Score  0 *  Have you tolerated food without any problems? Yes.    Have you been able to return to your normal activities? Yes.    Do you have any questions about your discharge instructions: Diet   No. Medications  No. Follow up visit  No.  Do you have questions or concerns about your Care? No.  Actions: * If pain score is 4 or above: No action needed, pain <4.

## 2018-04-01 ENCOUNTER — Encounter: Payer: Self-pay | Admitting: Family Medicine

## 2018-04-01 ENCOUNTER — Encounter: Payer: Self-pay | Admitting: Gastroenterology

## 2018-04-01 DIAGNOSIS — Z86010 Personal history of colonic polyps: Secondary | ICD-10-CM | POA: Insufficient documentation

## 2018-05-25 ENCOUNTER — Other Ambulatory Visit: Payer: Self-pay | Admitting: Family Medicine

## 2018-05-27 ENCOUNTER — Encounter: Payer: Self-pay | Admitting: Family Medicine

## 2018-05-27 ENCOUNTER — Ambulatory Visit (INDEPENDENT_AMBULATORY_CARE_PROVIDER_SITE_OTHER): Payer: BC Managed Care – PPO | Admitting: Family Medicine

## 2018-05-27 VITALS — Temp 97.7°F | Ht 73.0 in

## 2018-05-27 DIAGNOSIS — E039 Hypothyroidism, unspecified: Secondary | ICD-10-CM | POA: Diagnosis not present

## 2018-05-27 DIAGNOSIS — I10 Essential (primary) hypertension: Secondary | ICD-10-CM

## 2018-05-27 MED ORDER — LEVOTHYROXINE SODIUM 25 MCG PO TABS: 25.0000 ug | ORAL_TABLET | Freq: Every day | ORAL | 3 refills | Status: DC

## 2018-05-27 MED ORDER — LISINOPRIL 40 MG PO TABS: ORAL_TABLET | ORAL | 3 refills | Status: DC

## 2018-05-27 MED ORDER — LEVOTHYROXINE SODIUM 200 MCG PO TABS: ORAL_TABLET | ORAL | 3 refills | Status: DC

## 2018-05-27 MED ORDER — HYDROCHLOROTHIAZIDE 12.5 MG PO CAPS: ORAL_CAPSULE | ORAL | 3 refills | Status: DC

## 2018-05-27 NOTE — Progress Notes (Signed)
Phone [phone]   Subjective:  Virtual visit via Video note. Chief complaint: Chief Complaint  Patient presents with  . Hypertension   This visit type was conducted due to national recommendations for restrictions regarding the COVID-19 Pandemic (e.g. social distancing).  This format is felt to be most appropriate for this patient at this time balancing risks to patient and risks to population by having him in for in person visit.  No physical exam was performed (except for noted visual exam or audio findings with Telehealth visits).    Our team/I attempted to connect with Darrell Griffin on 05/27/18 at  9:20 AM EDT by a video enabled telemedicine application ([email or web address]) -Interactive audio and video telecommunications were attempted between this provider and patient, however failed, due to patient having technical difficulties OR patient did not have access to video capability.  We continued and completed visit with audio only. I verified that I am speaking with the correct person using two identifiers.  Location patient: [place] Location provider: [facility], office Persons participating in the virtual visit:  patient  Our team/I discussed the limitations of evaluation and management by telemedicine and the availability of in person appointments. In light of current covid-19 pandemic, patient also understands that we are trying to protect them by minimizing in office contact if at all possible.  The patient expressed consent for telemedicine visit and agreed to proceed. Patient understands insurance will be billed.   ROS- no fever/ chills/cough/ shortness of breath/ headache/ sore throat   Past Medical History-  Patient Active Problem List   Diagnosis Date Noted  . Hypothyroidism 10/22/2005    Priority: Medium  . Essential hypertension 10/22/2005    Priority: Medium  . Obesity, Class I, BMI 30-34.9 12/25/2011    Priority: Low  . History of adenomatous polyp of colon 04/01/2018     Medications- reviewed and updated Current Outpatient Medications  Medication Sig Dispense Refill  . hydrochlorothiazide (MICROZIDE) 12.5 MG capsule TAKE 1 CAPSULE BY MOUTH EVERY DAY 90 capsule 1  . levothyroxine (SYNTHROID) 25 MCG tablet TAKE 1 TABLET BY MOUTH EVERY DAY 30 tablet 0  . levothyroxine (SYNTHROID, LEVOTHROID) 200 MCG tablet TAKE 1 TABLET (200 MCG TOTAL) BY MOUTH DAILY BEFORE BREAKFAST. OFFICE VISIT BEFORE NEXT REFILL 90 tablet 2  . lisinopril (PRINIVIL,ZESTRIL) 40 MG tablet TAKE 1 TABLET BY MOUTH DAILY 90 tablet 1  . Acetaminophen (TYLENOL ARTHRITIS EXT RELIEF PO) Take 1,300 mg by mouth as needed.    Marland Kitchen aluminum chloride (DRYSOL) 20 % external solution Apply topically daily as needed. For underarm perspiration (Patient not taking: Reported on 05/27/2018) 60 mL 2  . omeprazole (PRILOSEC) 40 MG capsule Take 1 capsule (40 mg total) by mouth daily. (Patient not taking: Reported on 05/27/2018) 30 capsule 3     Objective:  Temp 97.7 F (36.5 C)   Ht 6\' 1"  (1.854 m)   BMI 31.53 kg/m  self reported vitals nonlabored voice, no audible wheeze    Assessment and Plan   #hypertension S: controlled on  lisinopril 40mg  and hctz 12.5mg  in the past BP Readings from Last 3 Encounters:  03/25/18 (!) 152/77- around colonoscopy- otherwise has been normal  03/11/18 132/68  02/24/18 110/62  A/P: he is going to try to locate his home cuff-if he is not successful in finding cuff he is going to get a new cuff.  Goal less than 140/90 for blood pressure.  Likely controlled- For now continue current medications  #hypothyroidism S: On thyroid medication-levothyroxine  225 mcg. asymptomatic.  Lab Results  Component Value Date   TSH 3.22 02/28/2017   A/P: suspect good control- offered for patient to come in for labs or do "car labs" but he prefers to wait until august or so- advised CPE around that time  Other notes: 1.healed up from shingles and right sided abdominal pain . Had colonoscopy with 3  year follow up due to polyps  He is going to call and get next available physical august or later. Update labs at that time  Lab/Order associations: No diagnosis found.  Meds ordered this encounter  Medications  . levothyroxine (SYNTHROID) 200 MCG tablet    Sig: Take along with 25 mcg    Dispense:  90 tablet    Refill:  3  . levothyroxine (SYNTHROID) 25 MCG tablet    Sig: Take 1 tablet (25 mcg total) by mouth daily. Take along with 200 mcg tablet daily    Dispense:  90 tablet    Refill:  3  . hydrochlorothiazide (MICROZIDE) 12.5 MG capsule    Sig: TAKE 1 CAPSULE BY MOUTH EVERY DAY    Dispense:  90 capsule    Refill:  3  . lisinopril (ZESTRIL) 40 MG tablet    Sig: TAKE 1 TABLET BY MOUTH DAILY    Dispense:  90 tablet    Refill:  3    Return precautions advised.  Garret Reddish, MD

## 2018-05-27 NOTE — Patient Instructions (Addendum)
There are no preventive care reminders to display for this patient.  Depression screen [facility] 2/9 02/24/2018 02/28/2017 06/24/2014  Decreased Interest 0 0 0  Down, Depressed, Hopeless 0 0 0  PHQ - 2 Score 0 0 0   Video visit

## 2018-07-02 ENCOUNTER — Telehealth: Payer: Self-pay

## 2018-07-02 NOTE — Telephone Encounter (Signed)
Called and spoke to pt and he would like Dr. Ansel Bong opinion regarding return to work.  Pt states that he works for [employer] and that he has been teleworking for quite a while due to some illness/sickness he had even prior to the local COVID-19 outbreak.  He states that [employer] has announced return to work on campus for Aug. 1st which will likely be phased in but pt doesn't have all the details.  He states that based on his age, weight, hx of childhood asthma as well as his recent bout of shingles; he is curious about Dr. Ansel Bong opinion as to whether he should return to work on-campus.  Please advise.

## 2018-07-02 NOTE — Telephone Encounter (Signed)
Copied from [place] [id number]. Topic: General - Other >> Jul 02, 2018  9:03 AM Antonieta Iba C wrote: Reason for CRM: pt is calling in to request a call back from provider. Pt wouldn't say why he need a call back.

## 2018-07-02 NOTE — Telephone Encounter (Signed)
He has a moderate risk for CXFQH-22 complications- if they can accommodate him working from home that would be great.  If they cannot accommodate this or he needs a letter- lets set up a visit to discuss further

## 2018-07-02 NOTE — Telephone Encounter (Signed)
Spoke to patient and discussed notes/recommendations per Dr. Yong Channel.  Patient will contact his employer and see where they stand with this; will contact us back regarding this.  I advised patient that based on his employer's response, we will move forward accordingly.  He verbalized understanding.

## 2018-07-19 ENCOUNTER — Other Ambulatory Visit: Payer: Self-pay | Admitting: Family Medicine

## 2018-08-03 ENCOUNTER — Other Ambulatory Visit: Payer: Self-pay | Admitting: Family Medicine

## 2018-09-22 ENCOUNTER — Telehealth: Payer: Self-pay

## 2018-09-22 NOTE — Telephone Encounter (Signed)
Please contact pt to schedule virtual visit to discuss. Thanks!

## 2018-09-22 NOTE — Progress Notes (Signed)
Phone [phone]   Subjective:  Virtual visit via phonenote Chief Complaint  Patient presents with  . Educate about Covid-19  . Discuss Return to Work   This visit type was conducted due to national recommendations for restrictions regarding the COVID-19 Pandemic (e.g. social distancing).  This format is felt to be most appropriate for this patient at this time balancing risks to patient and risks to population by having him in for in person visit.  All issues noted in this document were discussed and addressed.  No physical exam was performed (except for noted visual exam or audio findings with Telehealth visits).  The patient has consented to conduct a Telehealth visit and understands insurance will be billed.   Our team/I connected with Benay Pillow at  8:20 AM EDT by phone (patient did not have equipment for webex) and verified that I am speaking with the correct person using two identifiers.  Location patient: [place] Location provider: [facility], office Persons participating in the virtual visit:  patient  Time on phone: 12 minutes Counseling provided about  covid 19 symptoms, precautions and return to work  Our team/I discussed the limitations of evaluation and management by telemedicine and the availability of in person appointments. In light of current covid-19 pandemic, patient also understands that we are trying to protect them by minimizing in office contact if at all possible.  The patient expressed consent for telemedicine visit and agreed to proceed. Patient understands insurance will be billed.   ROS- No fever, chills, cough, congestion, runny nose, shortness of breath, fatigue, body aches, sore throat, headache, nausea, vomiting, diarrhea, or new loss of taste or smell. No known contacts with covid 19 or someone being tested for covid 19.   Past Medical History-  Patient Active Problem List   Diagnosis Date Noted  . Hypothyroidism 10/22/2005    Priority: Medium  .  Essential hypertension 10/22/2005    Priority: Medium  . Obesity, Class I, BMI 30-34.9 12/25/2011    Priority: Low  . History of adenomatous polyp of colon 04/01/2018    Medications- reviewed and updated Current Outpatient Medications  Medication Sig Dispense Refill  . Acetaminophen (TYLENOL ARTHRITIS EXT RELIEF PO) Take 1,300 mg by mouth as needed.    Marland Kitchen aluminum chloride (DRYSOL) 20 % external solution Apply topically daily as needed. For underarm perspiration (Patient not taking: Reported on 05/27/2018) 60 mL 2  . hydrochlorothiazide (MICROZIDE) 12.5 MG capsule TAKE 1 CAPSULE BY MOUTH EVERY DAY 90 capsule 3  . levothyroxine (SYNTHROID) 200 MCG tablet Take along with 25 mcg 90 tablet 3  . levothyroxine (SYNTHROID) 25 MCG tablet TAKE 1 TABLET BY MOUTH EVERY DAY 90 tablet 1  . lisinopril (ZESTRIL) 40 MG tablet TAKE 1 TABLET BY MOUTH DAILY 90 tablet 3   No current facility-administered medications for this visit.      Objective:  No self reported vitals  Nonlabored voice, normal speech      Assessment and Plan   # Discuss Return to Work/Covid-19 S:Patient has  Been at home since March with covid 19. Has been able to work from home. [employer] has phased in opening plan upcoming. They are at a point they want him to be on campus two days a week. We reviewed his covid 67 risk of complications score (HTN, age, obesity, male sex). Patient's wife is diabetic so he wants to be cautious as well) A/P:  Patient reviewed [employer] precautions including masking, social distancing, spacing within buildings. We discussed wearing  masks at all times when interacting with others. He does have moderate risk for covid 19 complications but with appropriate precautions in place it is reasonable for him to return to work.   We also discussed flu shot- will do at [facility] sept 21st. May defer shingrix this year.    Recommended follow up: already scheduled for [facility] Future Appointments  Date Time Provider [place]   10/12/2018  1:00 PM Marin Olp, MD [facility]   Lab/Order associations:   ICD-10-CM   1. Educated About Covid-19 Virus Infection  Z71.89   2. Essential hypertension  I10   3. Obesity, Class I, BMI 30-34.9  E66.9    Return precautions advised.  Garret Reddish, MD

## 2018-09-22 NOTE — Telephone Encounter (Signed)
Copied from [place] [id number]. 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 Patient would like a call back from Dr Yong Channel to discuss further what he thinks. Please call  Ph# [phone]

## 2018-09-22 NOTE — Patient Instructions (Signed)
Health Maintenance Due  Topic Date Due  . INFLUENZA VACCINE  08/22/2018    Depression screen [facility] 2/9 02/24/2018 02/28/2017 06/24/2014  Decreased Interest 0 0 0  Down, Depressed, Hopeless 0 0 0  PHQ - 2 Score 0 0 0

## 2018-09-23 ENCOUNTER — Ambulatory Visit (INDEPENDENT_AMBULATORY_CARE_PROVIDER_SITE_OTHER): Payer: BC Managed Care – PPO | Admitting: Family Medicine

## 2018-09-23 ENCOUNTER — Encounter: Payer: Self-pay | Admitting: Family Medicine

## 2018-09-23 DIAGNOSIS — I10 Essential (primary) hypertension: Secondary | ICD-10-CM | POA: Diagnosis not present

## 2018-09-23 DIAGNOSIS — Z7189 Other specified counseling: Secondary | ICD-10-CM | POA: Diagnosis not present

## 2018-09-23 DIAGNOSIS — E66811 Obesity, class 1: Secondary | ICD-10-CM

## 2018-09-23 DIAGNOSIS — E669 Obesity, unspecified: Secondary | ICD-10-CM | POA: Diagnosis not present

## 2018-10-12 ENCOUNTER — Ambulatory Visit (INDEPENDENT_AMBULATORY_CARE_PROVIDER_SITE_OTHER): Payer: BC Managed Care – PPO | Admitting: Family Medicine

## 2018-10-12 ENCOUNTER — Other Ambulatory Visit: Payer: Self-pay

## 2018-10-12 ENCOUNTER — Encounter: Payer: Self-pay | Admitting: Family Medicine

## 2018-10-12 VITALS — BP 122/82 | HR 55 | Temp 98.0°F | Ht 73.0 in | Wt 260.4 lb

## 2018-10-12 DIAGNOSIS — I10 Essential (primary) hypertension: Secondary | ICD-10-CM | POA: Diagnosis not present

## 2018-10-12 DIAGNOSIS — Z0000 Encounter for general adult medical examination without abnormal findings: Secondary | ICD-10-CM | POA: Diagnosis not present

## 2018-10-12 DIAGNOSIS — E039 Hypothyroidism, unspecified: Secondary | ICD-10-CM

## 2018-10-12 DIAGNOSIS — Z125 Encounter for screening for malignant neoplasm of prostate: Secondary | ICD-10-CM | POA: Diagnosis not present

## 2018-10-12 DIAGNOSIS — Z23 Encounter for immunization: Secondary | ICD-10-CM | POA: Diagnosis not present

## 2018-10-12 DIAGNOSIS — E669 Obesity, unspecified: Secondary | ICD-10-CM | POA: Diagnosis not present

## 2018-10-12 LAB — COMPREHENSIVE METABOLIC PANEL
ALT: 46 U/L (ref 0–53)
AST: 49 U/L — ABNORMAL HIGH (ref 0–37)
Albumin: 4.5 g/dL (ref 3.5–5.2)
Alkaline Phosphatase: 36 U/L — ABNORMAL LOW (ref 39–117)
BUN: 10 mg/dL (ref 6–23)
CO2: 27 mEq/L (ref 19–32)
Calcium: 9.1 mg/dL (ref 8.4–10.5)
Chloride: 105 mEq/L (ref 96–112)
Creatinine, Ser: 0.89 mg/dL (ref 0.40–1.50)
GFR: 84.25 mL/min (ref >60.00)
Glucose, Bld: 87 mg/dL (ref 70–99)
Potassium: 3.9 mEq/L (ref 3.5–5.1)
Sodium: 141 mEq/L (ref 135–145)
Total Bilirubin: 0.8 mg/dL (ref 0.2–1.2)
Total Protein: 6.6 g/dL (ref 6.0–8.3)

## 2018-10-12 LAB — CBC
HCT: 44 % (ref 39.0–52.0)
Hemoglobin: 15.2 g/dL (ref 13.0–17.0)
MCHC: 34.6 g/dL (ref 30.0–36.0)
MCV: 85.4 fl (ref 78.0–100.0)
Platelets: 189 10*3/uL (ref 150.0–400.0)
RBC: 5.15 Mil/uL (ref 4.22–5.81)
RDW: 13.4 % (ref 11.5–15.5)
WBC: 8.9 10*3/uL (ref 4.0–10.5)

## 2018-10-12 LAB — LIPID PANEL
Cholesterol: 147 mg/dL (ref 0–200)
HDL: 37 mg/dL — ABNORMAL LOW (ref >39.00)
LDL Cholesterol: 85 mg/dL (ref 0–99)
NonHDL: 109.81
Total CHOL/HDL Ratio: 4
Triglycerides: 123 mg/dL (ref 0.0–149.0)
VLDL: 24.6 mg/dL (ref 0.0–40.0)

## 2018-10-12 LAB — PSA: PSA: 3.34 ng/mL (ref 0.10–4.00)

## 2018-10-12 LAB — TSH: TSH: 6.01 u[IU]/mL — ABNORMAL HIGH (ref 0.35–4.50)

## 2018-10-12 MED ORDER — LEVOTHYROXINE SODIUM 200 MCG PO TABS: ORAL_TABLET | ORAL | 3 refills | Status: DC

## 2018-10-12 MED ORDER — HYDROCHLOROTHIAZIDE 12.5 MG PO CAPS: ORAL_CAPSULE | ORAL | 3 refills | Status: DC

## 2018-10-12 MED ORDER — LISINOPRIL 40 MG PO TABS: ORAL_TABLET | ORAL | 3 refills | Status: DC

## 2018-10-12 MED ORDER — LEVOTHYROXINE SODIUM 25 MCG PO TABS: 12.5000 ug | ORAL_TABLET | Freq: Every day | ORAL | 3 refills | Status: DC

## 2018-10-12 NOTE — Patient Instructions (Addendum)
Health Maintenance Due  Topic Date Due  . INFLUENZA VACCINE - today, high-dose 08/22/2018   Tdap next visit  Consider shingrix future visit- possibly 6 months   Please stop by lab before you go If you do not have mychart- we will call you about results within 5 business days of [place] receiving them.  If you have mychart- we will send your results within 3 business days of [place] receiving them.  If abnormal or we want to clarify a result, we will call or mychart you to make sure you receive the message.  If you have questions or concerns or don't hear within 5-7 days, please send [place] a message or call us.

## 2018-10-12 NOTE — Progress Notes (Signed)
Phone: [phone]   Subjective:  Patient presents today for their annual physical. Chief complaint-noted.   See problem oriented charting- ROS- full  review of systems was completed and negative  except for: sleep disturbance, urinary frequency/nocturia, some right knee pain at times   The following were reviewed and entered/updated in epic: Past Medical History:  Diagnosis Date  . GERD (gastroesophageal reflux disease)   . Hypertension   . Hypothyroidism    Patient Active Problem List   Diagnosis Date Noted  . Hypothyroidism 10/22/2005    Priority: Medium  . Essential hypertension 10/22/2005    Priority: Medium  . Obesity, Class I, BMI 30-34.9 12/25/2011    Priority: Low  . History of adenomatous polyp of colon 04/01/2018   Past Surgical History:  Procedure Laterality Date  . THYROIDECTOMY     "cold spot", ultimately benign    Family History  Problem Relation Age of Onset  . Seizures Father        past age 25  . Breast cancer Mother           . Hypertension Mother   . Hypertension Brother   . Colon cancer Neg Hx   . Esophageal cancer Neg Hx   . Rectal cancer Neg Hx   . Stomach cancer Neg Hx     Medications- reviewed and updated Current Outpatient Medications  Medication Sig Dispense Refill  . aluminum chloride (DRYSOL) 20 % external solution Apply topically daily as needed. For underarm perspiration 60 mL 2  . hydrochlorothiazide (MICROZIDE) 12.5 MG capsule TAKE 1 CAPSULE BY MOUTH EVERY DAY 90 capsule 3  . levothyroxine (SYNTHROID) 200 MCG tablet Take along with 25 mcg 90 tablet 3  . levothyroxine (SYNTHROID) 25 MCG tablet Take 0.5 tablets (12.5 mcg total) by mouth daily before breakfast. 45 tablet 3  . lisinopril (ZESTRIL) 40 MG tablet TAKE 1 TABLET BY MOUTH DAILY 90 tablet 3   No current facility-administered medications for this visit.     Allergies-reviewed and updated No Known Allergies  Social History   Social History Narrative   Married.  Daughter 46 in 2016. 5 granddaughters- 2 are stepdaughters.       Works at [employer] in [employer]      Hobbies: travel when able, time with grandkids   Objective  Objective:  BP 122/82 (BP Location: Left Arm, Patient Position: Sitting, Cuff Size: Normal)   Pulse (!) 55   Temp 98 F (36.7 C) (Temporal)   Ht 6\' 1"  (1.854 m)   Wt 260 lb 6.4 oz (118.1 kg)   SpO2 98%   BMI 34.36 kg/m  Gen: NAD, resting comfortably HEENT: Mucous membranes are moist. Oropharynx normal Neck: no thyromegaly CV: RRR no murmurs rubs or gallops Lungs: CTAB no crackles, wheeze, rhonchi Abdomen: soft/nontender/nondistended/normal bowel sounds. No rebound or guarding.  Ext: no edema Skin: warm, dry Neuro: grossly normal, moves all extremities, PERRLA Mske: reassurin knee exam   Assessment and Plan  73 y.o. male presenting for annual physical.  Health Maintenance counseling: 1. Anticipatory guidance: Patient counseled regarding regular dental exams -q6 months, eye exams -yearly,  avoiding smoking and second hand smoke , limiting alcohol to 2 beverages per day -doesn't drink.   2. Risk factor reduction:  Advised patient of need for regular exercise and diet rich and fruits and vegetables to reduce risk of heart attack and stroke. Exercise- was walking the dog once a day for 10-15 mins. Diet-had lost weight when he wasill- has regained  some and then gained some wit covid 19- encouraged need for weight loss. Has started to reverse this trend. Wt Readings from Last 3 Encounters:  10/12/18 260 lb 6.4 oz (118.1 kg)  03/25/18 239 lb (108.4 kg)  03/11/18 239 lb (108.4 kg)  3. 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  Flu shot today- high dose  Immunization History  Administered Date(s) Administered  . Influenza Split 12/25/2011  . Influenza, High Dose Seasonal PF 02/28/2017  . Pneumococcal Conjugate-13 06/24/2014  . Pneumococcal Polysaccharide-23 02/28/2017   . Td 10/14/2008  . Zoster 12/25/2011  4. Prostate cancer screening- typically would stop PSA checks at age 43-since last PSA was in 2016 at his last physical-we will go ahead and update PSA with labs. Nocturia 1-3x. Defers rectal for now Lab Results  Component Value Date   PSA 3.19 06/29/2014   PSA 3.21 12/25/2011   PSA 2.71 10/14/2008   5. Colon cancer screening - Hx of adenomatous colon polyp March 2020 with 3-year repeat 6. Skin cancer screening-  No dermatologist. advised regular sunscreen use. Denies worrisome, changing, or new skin lesions- other than spot on chest - appears to be seborrheic keratosis.  7.  Never smoker  Status of chronic or acute concerns  Hypertension -Taking HCTZ 12.5 mg daily and Lisinopril 40 mg daily. Not checking at home. Denies HA, CP, SOB, dizziness. Notes some visual changes. Blood pressure is well controlled today- continue current medication  Hypothyroidism - Taking Levothyroxine 212.5 mcg daily. No change with hair, skin, nails, increased sweating or anxiety, sensitivity to heat/cold, significant weight loss or gain. Some fatigue throughout the day.  Update TSH today Lab Results  Component Value Date   TSH 3.22 02/28/2017  - of note he does take thyroid medicine with other meds in AM- discussed optimal way to take- he prefers current regimen as long as #s ok  Leg cramps- considering mens senior MV, magnesium, may trial mustard.   Recommended follow up: 18-month follow-up or sooner if needed  Lab/Order associations: fasting   ICD-10-CM   1. Preventative health care  Z00.00 CBC    Comprehensive metabolic panel    Lipid panel    TSH    PSA  2. Hypothyroidism, unspecified type  E03.9 CBC    Comprehensive metabolic panel    Lipid panel    TSH  3. Essential hypertension  I10 CBC    Comprehensive metabolic panel    Lipid panel  4. Obesity, Class I, BMI 30-34.9  E66.9   5. Screening for prostate cancer  Z12.5 PSA   Meds ordered this encounter   Medications  . hydrochlorothiazide (MICROZIDE) 12.5 MG capsule    Sig: TAKE 1 CAPSULE BY MOUTH EVERY DAY    Dispense:  90 capsule    Refill:  3  . levothyroxine (SYNTHROID) 200 MCG tablet    Sig: Take along with 25 mcg    Dispense:  90 tablet    Refill:  3  . levothyroxine (SYNTHROID) 25 MCG tablet    Sig: Take 0.5 tablets (12.5 mcg total) by mouth daily before breakfast.    Dispense:  45 tablet    Refill:  3  . lisinopril (ZESTRIL) 40 MG tablet    Sig: TAKE 1 TABLET BY MOUTH DAILY    Dispense:  90 tablet    Refill:  3    Return precautions advised.  Garret Reddish, MD

## 2018-10-12 NOTE — Addendum Note (Signed)
Addended by: Jasper Loser on: 10/12/2018 02:05 PM   Modules accepted: Orders

## 2018-11-17 ENCOUNTER — Ambulatory Visit (INDEPENDENT_AMBULATORY_CARE_PROVIDER_SITE_OTHER): Payer: BC Managed Care – PPO | Admitting: Family Medicine

## 2018-11-17 ENCOUNTER — Encounter: Payer: Self-pay | Admitting: Family Medicine

## 2018-11-17 ENCOUNTER — Other Ambulatory Visit: Payer: Self-pay

## 2018-11-17 DIAGNOSIS — M79652 Pain in left thigh: Secondary | ICD-10-CM

## 2018-11-17 NOTE — Assessment & Plan Note (Signed)
Story/exam most consistent with iliotibial band syndrome on left. Supportive care reviewed. Provided with exercises from [facility] pt advisor. Treat with heat, OTC aleve 220mg  BID with meals x 5 days then PRN. Update if not improving for referral to Dr Lorelei Pont. Pt agrees with plan.

## 2018-11-17 NOTE — Progress Notes (Signed)
This visit was conducted in person.  BP 132/64 (BP Location: Left Arm, Patient Position: Sitting, Cuff Size: Large)   Pulse 61   Temp 98.3 F (36.8 C) (Temporal)   Ht 6\' 1"  (1.854 m)   Wt 258 lb 9 oz (117.3 kg)   SpO2 97%   BMI 34.11 kg/m    CC: L leg pain Subjective:    Patient ID: Darrell Griffin, male    DOB: Feb 18, 1945, 73 y.o.   MRN: [id number]  HPI: NIV LUISI is a 73 y.o. male presenting on 11/17/2018 for Leg Pain (C/o left leg pain. Pain is in upper lataeral leg into the left hip. Occurs when walking.  Started about 4 days ago. Denies injury. )   4d h/o L lateral hip pain down into thigh, also with some pain along L anterior superior knee. Pain described as sore muscle. Worse at bedtime, better when he wakes up in the morning. Hasn't tried anything for this yet.  Denies inciting trauma/injury or fall.  No lower back pain. No groin pain. No unilateral leg swelling. No fevers/chills, saddle anesthesia, leg weakness or bowel/bladder incontinence.   Patient considering switching practices as he lives in [place].      Relevant past medical, surgical, family and social history reviewed and updated as indicated. Interim medical history since our last visit reviewed. Allergies and medications reviewed and updated. Outpatient Medications Prior to Visit  Medication Sig Dispense Refill  . hydrochlorothiazide (MICROZIDE) 12.5 MG capsule TAKE 1 CAPSULE BY MOUTH EVERY DAY 90 capsule 3  . levothyroxine (SYNTHROID) 200 MCG tablet Take along with 25 mcg 90 tablet 3  . levothyroxine (SYNTHROID) 25 MCG tablet Take 0.5 tablets (12.5 mcg total) by mouth daily before breakfast. 45 tablet 3  . lisinopril (ZESTRIL) 40 MG tablet TAKE 1 TABLET BY MOUTH DAILY 90 tablet 3  . aluminum chloride (DRYSOL) 20 % external solution Apply topically daily as needed. For underarm perspiration 60 mL 2   No facility-administered medications prior to visit.      Per HPI unless specifically indicated in  ROS section below Review of Systems Objective:    BP 132/64 (BP Location: Left Arm, Patient Position: Sitting, Cuff Size: Large)   Pulse 61   Temp 98.3 F (36.8 C) (Temporal)   Ht 6\' 1"  (1.854 m)   Wt 258 lb 9 oz (117.3 kg)   SpO2 97%   BMI 34.11 kg/m   Wt Readings from Last 3 Encounters:  11/17/18 258 lb 9 oz (117.3 kg)  10/12/18 260 lb 6.4 oz (118.1 kg)  03/25/18 239 lb (108.4 kg)    Physical Exam Vitals signs and nursing note reviewed.  Constitutional:      General: He is not in acute distress.    Appearance: Normal appearance. He is obese. He is not ill-appearing.  Musculoskeletal: Normal range of motion.        General: No swelling or tenderness.     Right lower leg: No edema.     Left lower leg: No edema.     Comments:  No pain midline spine No paraspinous mm tenderness Neg SLR bilaterally. No pain with int/ext rotation at hip. Neg FABER. No reproducible pain at SIJ, GTB or sciatic notch bilaterally.  No bony pain along femur, no bone or muscle mass appreciated Reproducible tenderness L lateral thigh along proximal iliotibial band  Skin:    General: Skin is warm and dry.     Capillary Refill: Capillary refill takes less than  2 seconds.     Findings: No erythema or rash.  Neurological:     General: No focal deficit present.        Assessment & Plan:   Problem List Items Addressed This Visit    Pain of left lateral upper thigh    Story/exam most consistent with iliotibial band syndrome on left. Supportive care reviewed. Provided with exercises from [facility] pt advisor. Treat with heat, OTC aleve 220mg  BID with meals x 5 days then PRN. Update if not improving for referral to Dr Lorelei Pont. Pt agrees with plan.           No orders of the defined types were placed in this encounter.  No orders of the defined types were placed in this encounter.   Patient Instructions  I think you have iliotibial band syndrome. Trial aleve 220mg  twice daily with meals for 5 days.   Do exercise provided today. Let us know if not improving with treatment.  Ice/heating pad to lateral thigh, if no better return to see Dr Lorelei Pont sports medicine.    Follow up plan: Return if symptoms worsen or fail to improve.  Ria Bush, MD

## 2018-11-17 NOTE — Patient Instructions (Addendum)
I think you have iliotibial band syndrome. Trial aleve 220mg  twice daily with meals for 5 days.  Do exercise provided today. Let us know if not improving with treatment.  Ice/heating pad to lateral thigh, if no better return to see Dr Lorelei Pont sports medicine.

## 2018-11-18 ENCOUNTER — Telehealth: Payer: Self-pay

## 2018-11-18 NOTE — Telephone Encounter (Signed)
May take aleve 220mg  up to 2 tablets (440mg  dose) twice daily for 5 days, and add tylenol 500mg  with meals (TID) if needed.  Let us know if ongoing pain despite this for stronger Rx medication.

## 2018-11-18 NOTE — Telephone Encounter (Signed)
Pt left v/m that pt was seen 11/17/18 and in addition to aleve what can pt take for pain such as tylenol? Pt request cb.

## 2018-11-18 NOTE — Telephone Encounter (Signed)
Pt notified as instructed by phone.  Verbalizes understanding.  

## 2018-11-30 ENCOUNTER — Telehealth: Payer: Self-pay

## 2018-11-30 NOTE — Telephone Encounter (Signed)
Pt notified as instructed.  Verbalizes understanding.  Says he will contact Dr. Yong Channel concerning the wheezing.

## 2018-11-30 NOTE — Telephone Encounter (Signed)
Can schedule 1140 virtual visit tomorrow with me to discuss symptoms and testing

## 2018-11-30 NOTE — Telephone Encounter (Signed)
Agree with scheduling f/u with Dr Lorelei Pont at end of self quarantine 12/08/2018 for ongoing L leg pain.  Regarding other symptoms, if he's having any shortness of breath or fever or worsening cough, recommend he get tested. Let us know if progressing symptoms.  I don't see h/o COPD or asthma in chart.

## 2018-11-30 NOTE — Telephone Encounter (Signed)
Pt left v/m that he was seen 11/17/18 and pt has been doing what instructed and problem with Lt leg is no better and pt wants to proceed with next step. Per 11/17/18 note if pt does not see improvement to schedule appt with Dr Lorelei Pont. Pt has no covid symptoms except pt said for last couple of nights pt has had wheezing and or rattling in chest;slight dry cough and weakness in lt thigh muscle, no travel and no direct known exposure to + covid.. pt said on 11/24/18 pt's granddaughter who is staying with pt was exposed to teacher at school that tested positive for covid. School was closed for 2 wks which should end on 12/08/18.pts granddaughter does not have covid symptoms and has not been tested for covid.  Pt will cb when 2 wk quarantining time is over on 12/08/18 to schedule appt with Dr Lorelei Pont. Pt will ck with his PCP about what to do about his symptoms listed above and possible covid testing if needed. Pt voiced understanding. FYI to PCP and Dr Danise Mina.

## 2018-12-01 ENCOUNTER — Encounter: Payer: Self-pay | Admitting: Family Medicine

## 2018-12-01 ENCOUNTER — Ambulatory Visit (INDEPENDENT_AMBULATORY_CARE_PROVIDER_SITE_OTHER): Payer: BC Managed Care – PPO | Admitting: Family Medicine

## 2018-12-01 VITALS — Temp 98.0°F | Ht 73.0 in | Wt 258.0 lb

## 2018-12-01 DIAGNOSIS — Z20828 Contact with and (suspected) exposure to other viral communicable diseases: Secondary | ICD-10-CM | POA: Diagnosis not present

## 2018-12-01 DIAGNOSIS — R05 Cough: Secondary | ICD-10-CM | POA: Diagnosis not present

## 2018-12-01 DIAGNOSIS — Z8709 Personal history of other diseases of the respiratory system: Secondary | ICD-10-CM | POA: Diagnosis not present

## 2018-12-01 DIAGNOSIS — Z20822 Contact with and (suspected) exposure to covid-19: Secondary | ICD-10-CM

## 2018-12-01 DIAGNOSIS — R059 Cough, unspecified: Secondary | ICD-10-CM

## 2018-12-01 MED ORDER — ALBUTEROL SULFATE HFA 108 (90 BASE) MCG/ACT IN AERS: 2.0000 | INHALATION_SPRAY | Freq: Four times a day (QID) | RESPIRATORY_TRACT | 0 refills | Status: DC | PRN

## 2018-12-01 NOTE — Progress Notes (Signed)
Phone [phone] Virtual visit via phonenote   Subjective:   Chief Complaint  Patient presents with  . Follow-up   This visit type was conducted due to national recommendations for restrictions regarding the COVID-19 Pandemic (e.g. social distancing).  This format is felt to be most appropriate for this patient at this time balancing risks to patient and risks to population by having him in for in person visit.  All issues noted in this document were discussed and addressed.  No physical exam was performed (except for noted visual exam or audio findings with Telehealth visits).  The patient has consented to conduct a Telehealth visit and understands insurance will be billed.   Our team/I connected with Benay Pillow at 11:40 AM EST by phone (patient did not have equipment for webex) and verified that I am speaking with the correct person using two identifiers.  Location patient: [place] Location provider: [facility], office Persons participating in the virtual visit:  patient  Time on phone: 15 minutes Counseling provided about  covid 19, possible asthma, possibility of covid exposure   Our team/I discussed the limitations of evaluation and management by telemedicine and the availability of in person appointments. In light of current covid-19 pandemic, patient also understands that we are trying to protect them by minimizing in office contact if at all possible.  The patient expressed consent for telemedicine visit and agreed to proceed. Patient understands insurance will be billed.   ROS- No fever, chills, unny nose, shortness of breath, fatigue, body aches, sore throat, headache, nausea, vomiting, diarrhea, or new loss of taste or smell. No known contacts with covid 83 but granddaughter may have been exposed and she is around him.   Past Medical History-  Patient Active Problem List   Diagnosis Date Noted  . Hypothyroidism 10/22/2005    Priority: Medium  . Essential hypertension  10/22/2005    Priority: Medium  . Obesity, Class I, BMI 30-34.9 12/25/2011    Priority: Low  . Pain of left lateral upper thigh 11/17/2018  . History of adenomatous polyp of colon 04/01/2018    Medications- reviewed and updated Current Outpatient Medications  Medication Sig Dispense Refill  . albuterol (VENTOLIN HFA) 108 (90 Base) MCG/ACT inhaler Inhale 2 puffs into the lungs every 6 (six) hours as needed for wheezing or shortness of breath. 18 g 0  . hydrochlorothiazide (MICROZIDE) 12.5 MG capsule TAKE 1 CAPSULE BY MOUTH EVERY DAY 90 capsule 3  . levothyroxine (SYNTHROID) 200 MCG tablet Take along with 25 mcg 90 tablet 3  . levothyroxine (SYNTHROID) 25 MCG tablet Take 0.5 tablets (12.5 mcg total) by mouth daily before breakfast. 45 tablet 3  . lisinopril (ZESTRIL) 40 MG tablet TAKE 1 TABLET BY MOUTH DAILY 90 tablet 3   No current facility-administered medications for this visit.      Objective:  Temp 98 F (36.7 C) (Oral)   Ht 6\' 1"  (1.854 m)   Wt 258 lb (117 kg)   BMI 34.04 kg/m  self reported vitals  Nonlabored voice, normal speech      Assessment and Plan   # cough/wheeze and possible exposure to covid 43 S:patient's granddaughter had contact with covid 17 teacher- had PE with him last Tuesday and he tested positive on Thursday of last week- neither were masked and they were throwing footballa nd basketball together. Granddaughter has been with him since Saturday. Since she has been there he has started with some mild symptoms. At bedtime was noting some breathing  when he laid down and slight rattle in his chest.  Slightly groggy and fatigue this AM but didn't sleep well. His symptoms started the first day she came to stay with him. Very slight dry cough  History of childhood asthma and occasionally into adulthood but never very dramatic A/P: Possible viral infection but need to rule out COVID-19.  May be triggering underlying asthma.  Can try albuterol for wheeze and  cough-since these are mild did not want to start prednisone at this time but would consider if symptoms worsen and particularly if COVID-19 test negative  Patient with symptoms concerning for potential covid 19 Therefore: - testing ordered for covid 19 and information provided on drive up testing -he would likely do in [place] - recommended patient watch closely for shortness of breath or confusion or worsening symptoms and if those occur he should contact us immediately  -recommended self isolation until negative test  -Hopeful results back within 48 hours of test -Does not need work note  Recommended follow up: As needed for acute condition Future Appointments  Date Time Provider [place]  04/15/2019 10:00 AM Marin Olp, MD [facility]  10/15/2019  9:40 AM Yong Channel Brayton Mars, MD [facility]    Lab/Order associations:   ICD-10-CM   1. Cough  R05 Novel Coronavirus, NAA ([facility])  2. Exposure to COVID-19 virus  Z20.828 Novel Coronavirus, NAA ([facility])  3. History of asthma  Z87.09     Meds ordered this encounter  Medications  . albuterol (VENTOLIN HFA) 108 (90 Base) MCG/ACT inhaler    Sig: Inhale 2 puffs into the lungs every 6 (six) hours as needed for wheezing or shortness of breath.    Dispense:  18 g    Refill:  0   Return precautions advised.  Garret Reddish, MD

## 2018-12-01 NOTE — Progress Notes (Deleted)
Duplicate note

## 2018-12-01 NOTE — Telephone Encounter (Signed)
Sent to [place] for scheduling.

## 2018-12-01 NOTE — Patient Instructions (Signed)
Health Maintenance Due  Topic Date Due  . Darrell Griffin will address at next visit  10/15/2018

## 2018-12-01 NOTE — Telephone Encounter (Signed)
This appointment has been scheduled.

## 2018-12-02 ENCOUNTER — Other Ambulatory Visit: Payer: Self-pay

## 2018-12-02 DIAGNOSIS — Z20822 Contact with and (suspected) exposure to covid-19: Secondary | ICD-10-CM

## 2018-12-04 LAB — NOVEL CORONAVIRUS, NAA: SARS-CoV-2, NAA: NOT DETECTED

## 2019-03-04 ENCOUNTER — Ambulatory Visit: Payer: BC Managed Care – PPO | Attending: Family

## 2019-03-04 DIAGNOSIS — Z23 Encounter for immunization: Secondary | ICD-10-CM

## 2019-03-04 NOTE — Progress Notes (Signed)
   Covid-19 Vaccination Clinic  Name:  Darrell Griffin    MRN: [id number] DOB: Oct 24, 1945  03/04/2019  [name] was observed post Covid-19 immunization for 15 minutes without incidence. He was provided with Vaccine Information Sheet and instruction to access the V-Safe system.   [name] was instructed to call 911 with any severe reactions post vaccine: Marland Kitchen Difficulty breathing  . Swelling of your face and throat  . A fast heartbeat  . A bad rash all over your body  . Dizziness and weakness    Immunizations Administered    Name Date Dose VIS Date Route   Moderna COVID-19 Vaccine 03/04/2019  1:35 PM 0.5 mL 12/22/2018 Intramuscular   Manufacturer: Moderna   Lot: [id number]   [id number]

## 2019-04-06 ENCOUNTER — Ambulatory Visit: Payer: BC Managed Care – PPO | Attending: Family

## 2019-04-06 DIAGNOSIS — Z23 Encounter for immunization: Secondary | ICD-10-CM

## 2019-04-06 NOTE — Progress Notes (Signed)
   Covid-19 Vaccination Clinic  Name:  Darrell Griffin    MRN: [id number] DOB: 07/27/1945  04/06/2019  [name] was observed post Covid-19 immunization for 15 minutes without incident. He was provided with Vaccine Information Sheet and instruction to access the V-Safe system.   [name] was instructed to call 911 with any severe reactions post vaccine: Marland Kitchen Difficulty breathing  . Swelling of face and throat  . A fast heartbeat  . A bad rash all over body  . Dizziness and weakness   Immunizations Administered    Name Date Dose VIS Date Route   Moderna COVID-19 Vaccine 04/06/2019  4:07 PM 0.5 mL 12/22/2018 Intramuscular   Manufacturer: Moderna   Lot: [id number]   [id number]

## 2019-04-08 ENCOUNTER — Other Ambulatory Visit: Payer: Self-pay | Admitting: Family Medicine

## 2019-04-15 ENCOUNTER — Ambulatory Visit: Payer: BC Managed Care – PPO | Admitting: Family Medicine

## 2019-04-15 ENCOUNTER — Encounter: Payer: Self-pay | Admitting: Family Medicine

## 2019-04-27 ENCOUNTER — Ambulatory Visit (INDEPENDENT_AMBULATORY_CARE_PROVIDER_SITE_OTHER): Payer: BC Managed Care – PPO | Admitting: Family Medicine

## 2019-04-27 ENCOUNTER — Other Ambulatory Visit: Payer: Self-pay

## 2019-04-27 ENCOUNTER — Encounter: Payer: Self-pay | Admitting: Family Medicine

## 2019-04-27 VITALS — BP 126/74 | HR 74 | Temp 98.1°F | Ht 73.0 in | Wt 257.0 lb

## 2019-04-27 DIAGNOSIS — Z23 Encounter for immunization: Secondary | ICD-10-CM | POA: Diagnosis not present

## 2019-04-27 DIAGNOSIS — I10 Essential (primary) hypertension: Secondary | ICD-10-CM

## 2019-04-27 DIAGNOSIS — E039 Hypothyroidism, unspecified: Secondary | ICD-10-CM | POA: Diagnosis not present

## 2019-04-27 DIAGNOSIS — E785 Hyperlipidemia, unspecified: Secondary | ICD-10-CM | POA: Diagnosis not present

## 2019-04-27 NOTE — Progress Notes (Signed)
Phone [phone] In person visit   Subjective:   Darrell Griffin is a 74 y.o. year old very pleasant male patient who presents for/with See problem oriented charting Chief Complaint  Patient presents with  . Follow-up   This visit occurred during the SARS-CoV-2 public health emergency.  Safety protocols were in place, including screening questions prior to the visit, additional usage of staff PPE, and extensive cleaning of exam room while observing appropriate contact time as indicated for disinfecting solutions.   Past Medical History-  Patient Active Problem List   Diagnosis Date Noted  . Hypothyroidism 10/22/2005    Priority: Medium  . Essential hypertension 10/22/2005    Priority: Medium  . Obesity, Class I, BMI 30-34.9 12/25/2011    Priority: Low  . Pain of left lateral upper thigh 11/17/2018  . History of adenomatous polyp of colon 04/01/2018    Medications- reviewed and updated Current Outpatient Medications  Medication Sig Dispense Refill  . hydrochlorothiazide (MICROZIDE) 12.5 MG capsule TAKE 1 CAPSULE BY MOUTH EVERY DAY 90 capsule 3  . levothyroxine (SYNTHROID) 200 MCG tablet Take along with 25 mcg 90 tablet 3  . levothyroxine (SYNTHROID) 25 MCG tablet TAKE 1 TABLET BY MOUTH EVERY DAY 90 tablet 1  . lisinopril (ZESTRIL) 40 MG tablet TAKE 1 TABLET BY MOUTH DAILY 90 tablet 3   No current facility-administered medications for this visit.     Objective:  BP 126/74   Pulse 74   Temp 98.1 F (36.7 C) (Temporal)   Ht 6\' 1"  (1.854 m)   Wt 257 lb (116.6 kg)   SpO2 95%   BMI 33.91 kg/m  Gen: NAD, resting comfortably CV: RRR no murmurs rubs or gallops Lungs: CTAB no crackles, wheeze, rhonchi Ext: no edema Skin: warm, dry    Assessment and Plan   #hypertension S: compliant with hydrochlorothiazide 12.5 mg and lisinopril 40 mg BP Readings from Last 3 Encounters:  04/27/19 126/74  11/17/18 132/64  10/12/18 122/82  A/P: Stable. Continue current medications.      #hypothyroidism S: compliant On thyroid medication-levothyroxine 225 mcg-  200 mcg +25 mcg tablet- started trying to take it before he takes other meds or foods (if he remembers) Lab Results  Component Value Date   TSH 6.01 (H) 10/12/2018   A/P: Hopefully controlled-update TSH today. Glad he is trying to separate this from other meds- hoping #s better today   #Mild AST elevation-update LFTs-if increases greater than 2 times upper limit of normal consider further work-up.  Recommended avoidance-patient reports no alcohol intake.  Patient does have elevated lipids and 10-year risk over 20% and could consider statin but want LFTs to normalize before we do so  Lab Results  Component Value Date   ALT 46 10/12/2018   AST 49 (H) 10/12/2018   ALKPHOS 36 (L) 10/12/2018   BILITOT 0.8 10/12/2018   Wt Readings from Last 3 Encounters:  04/27/19 257 lb (116.6 kg)  12/01/18 258 lb (117 kg)  11/17/18 258 lb 9 oz (117.3 kg)    #hyperlipidemia S: not on statin. Weight stable Lab Results  Component Value Date   CHOL 147 10/12/2018   HDL 37.00 (L) 10/12/2018   LDLCALC 85 10/12/2018   TRIG 123.0 10/12/2018   CHOLHDL 4 10/12/2018   A/P: mild poor control and ascvd risk above 20%- hed like to work on lifestyle choices first. We set a goal of 10 lbs down by 6 months. Encouraged need for healthy eating, regular exercise, weight loss.  -  hed prefer pulse dosing if needed- perhaps rosuvastatin 20mg  once a week  #nocturia- wants to consider medicine potentially- right now symptoms arent bad enough. Does take hctz at night so moving to am may help  Recommended follow up: Return in about 6 months (around 10/27/2019) for physical or sooner if needed. Future Appointments  Date Time Provider [place]  10/20/2019  9:40 AM Yong Channel, Brayton Mars, MD [facility]   Lab/Order associations:   ICD-10-CM   1. Hypothyroidism, unspecified type  E03.9 TSH  2. Essential hypertension  I10 Comprehensive metabolic  panel  3. Hyperlipidemia, unspecified hyperlipidemia type  E78.5 Comprehensive metabolic panel   Return precautions advised.  Garret Reddish, MD

## 2019-04-27 NOTE — Patient Instructions (Addendum)
Shingrix #1 today. Repeat injection in 2-6 months. Schedule a nurse visit for the 2nd injection before you leave today (at the check out desk)  Please stop by lab before you go If you do not have mychart- we will call you about results within 5 business days of [place] receiving them.  If you have mychart- we will send your results within 3 business days of [place] receiving them.  If abnormal or we want to clarify a result, we will call or mychart you to make sure you receive the message.  If you have questions or concerns or don't hear within 5 business days, please send [place] a message or call us.     Recommended follow up: Return in about 6 months (around 10/27/2019) for physical or sooner if needed.

## 2019-04-27 NOTE — Addendum Note (Signed)
Addended by: Francella Solian on: 04/27/2019 03:04 PM   Modules accepted: Orders

## 2019-04-28 LAB — COMPREHENSIVE METABOLIC PANEL
ALT: 38 U/L (ref 0–53)
AST: 32 U/L (ref 0–37)
Albumin: 4.3 g/dL (ref 3.5–5.2)
Alkaline Phosphatase: 35 U/L — ABNORMAL LOW (ref 39–117)
BUN: 14 mg/dL (ref 6–23)
CO2: 28 mEq/L (ref 19–32)
Calcium: 9.3 mg/dL (ref 8.4–10.5)
Chloride: 105 mEq/L (ref 96–112)
Creatinine, Ser: 0.97 mg/dL (ref 0.40–1.50)
GFR: 76.16 mL/min (ref >60.00)
Glucose, Bld: 105 mg/dL — ABNORMAL HIGH (ref 70–99)
Potassium: 3.8 mEq/L (ref 3.5–5.1)
Sodium: 142 mEq/L (ref 135–145)
Total Bilirubin: 0.9 mg/dL (ref 0.2–1.2)
Total Protein: 6.3 g/dL (ref 6.0–8.3)

## 2019-04-28 LAB — TSH: TSH: 0.72 u[IU]/mL (ref 0.35–4.50)

## 2019-06-29 ENCOUNTER — Other Ambulatory Visit: Payer: Self-pay

## 2019-06-29 ENCOUNTER — Ambulatory Visit (INDEPENDENT_AMBULATORY_CARE_PROVIDER_SITE_OTHER): Payer: BC Managed Care – PPO

## 2019-06-29 DIAGNOSIS — Z23 Encounter for immunization: Secondary | ICD-10-CM | POA: Diagnosis not present

## 2019-07-06 IMAGING — CT CT RENAL STONE PROTOCOL
2 of 4 series · 17 of 46 positions shown, 19 images · non-contrast
Comparison: None.

CLINICAL DATA: Right-sided flank pain

EXAM:
CT ABDOMEN AND PELVIS WITHOUT CONTRAST
TECHNIQUE: Multidetector CT imaging of the abdomen and pelvis was performed
following the standard protocol without IV contrast.

[Series 2: stone full standard · axial · 0.79mm/px · z∈[-1104,-688]mm · 14 of 91 slices shown, 16 images]
[im 4/91  soft-tissue]
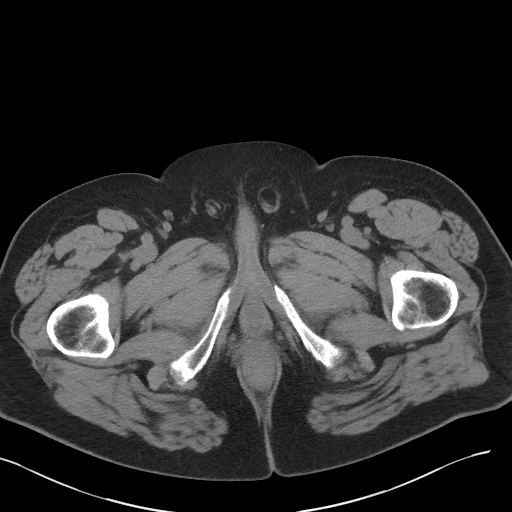
[im 4/91  bone]
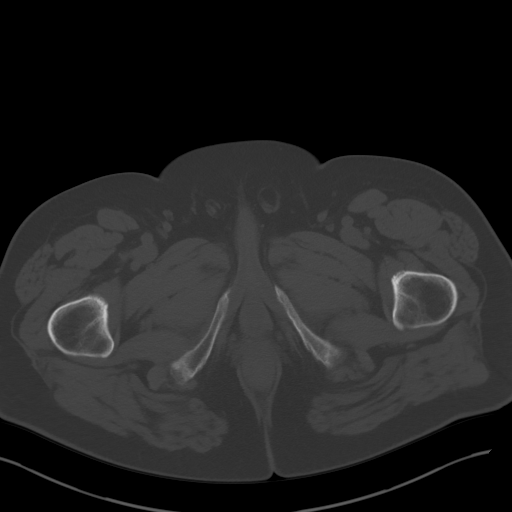
[im 12/91  soft-tissue]
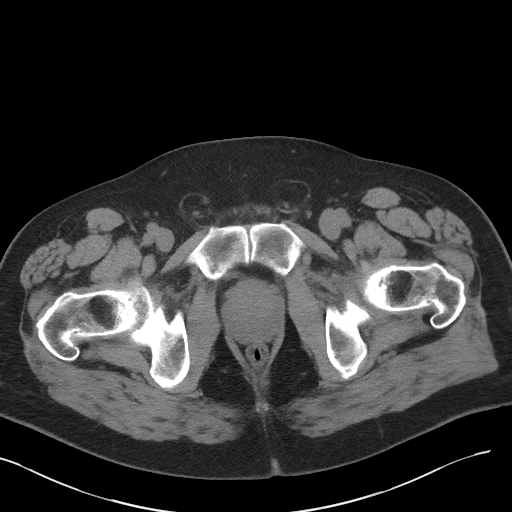
[im 19/91  soft-tissue]
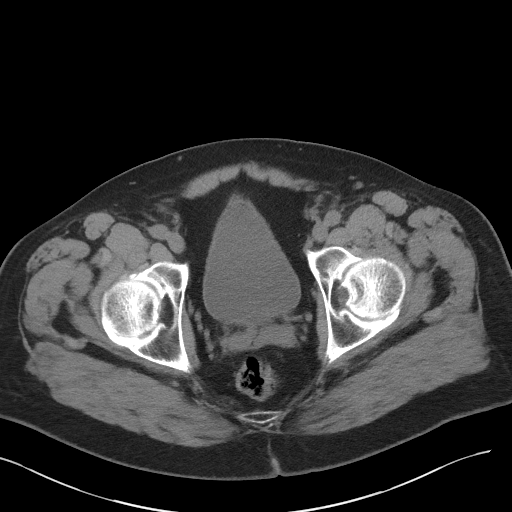
[im 23/91  soft-tissue]
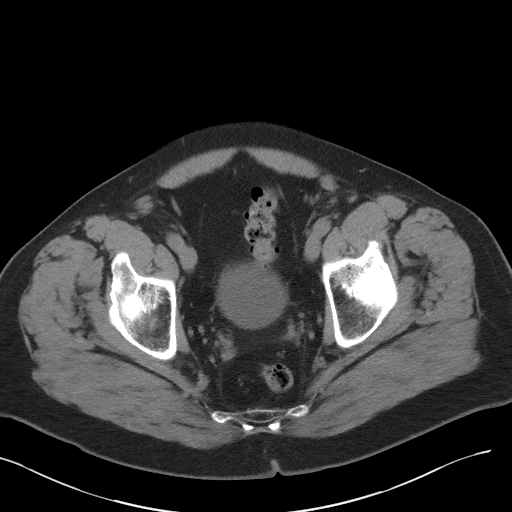
[im 31/91  soft-tissue]
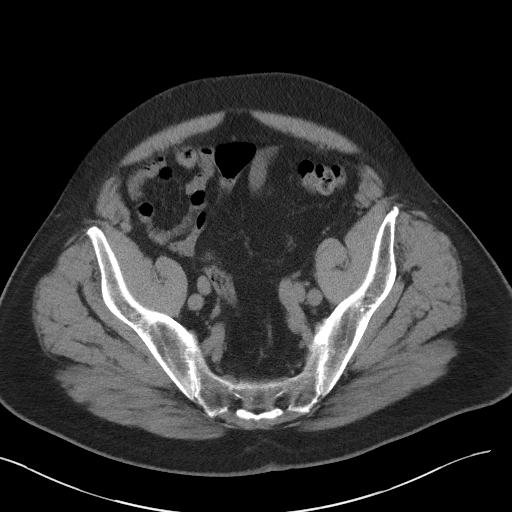
[im 38/91  soft-tissue]
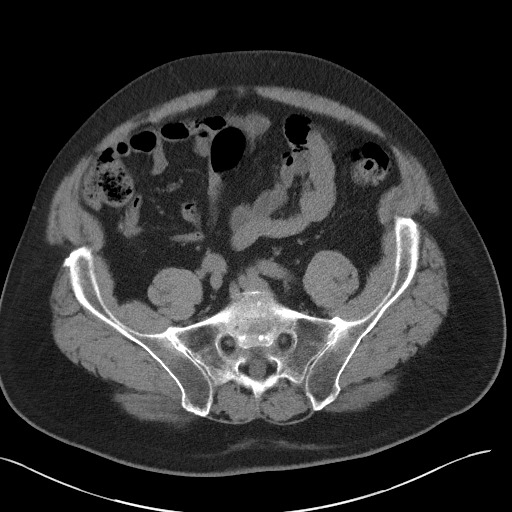
[im 42/91  soft-tissue]
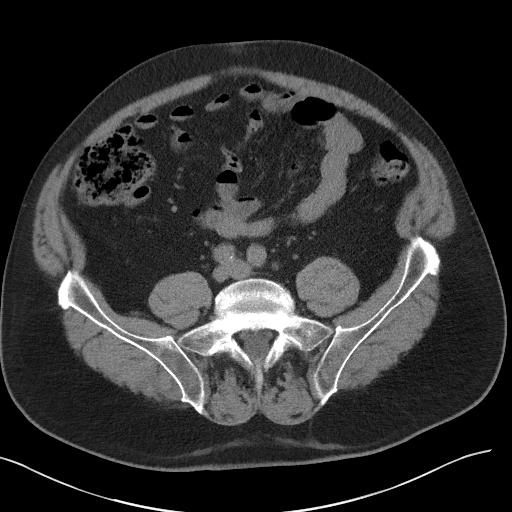
[im 49/91  soft-tissue]
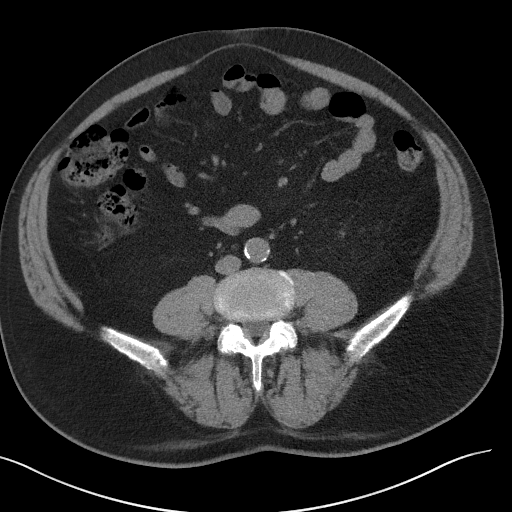
[im 53/91  soft-tissue]
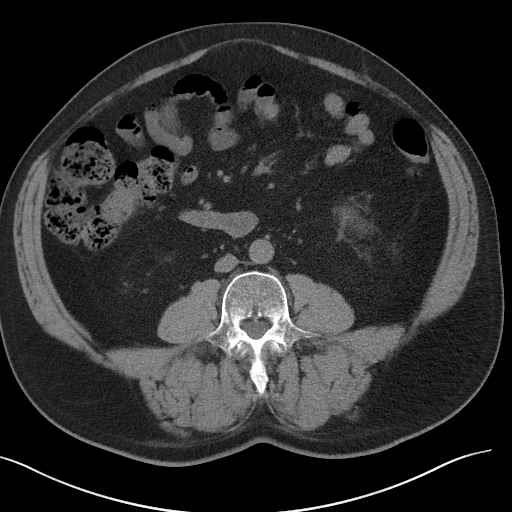
[im 53/91  bone]
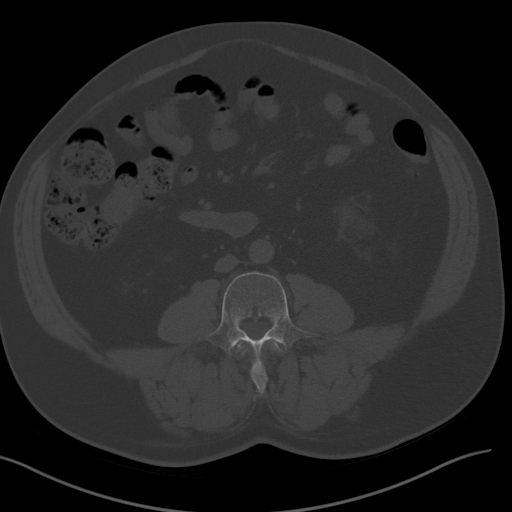
[im 61/91  soft-tissue]
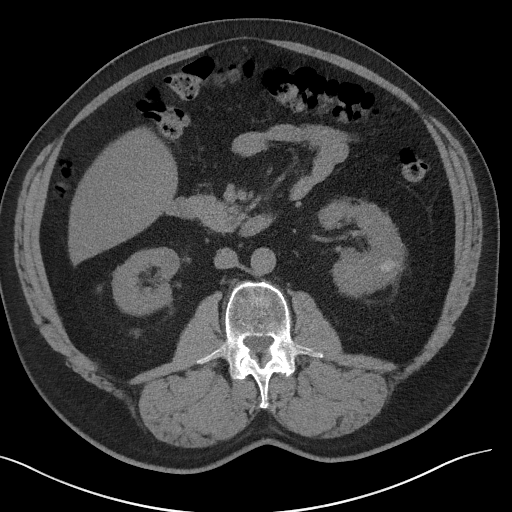
[im 68/91  soft-tissue]
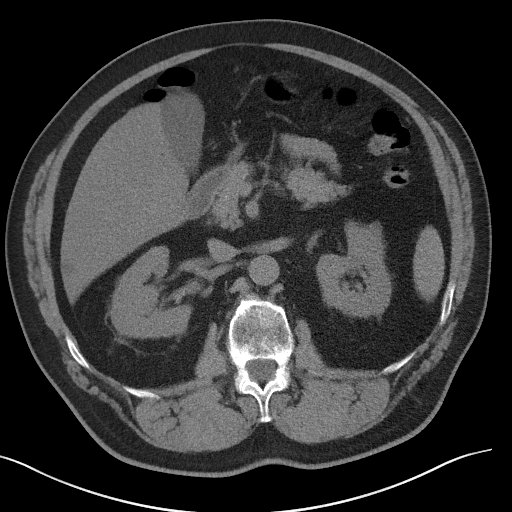
[im 72/91  soft-tissue]
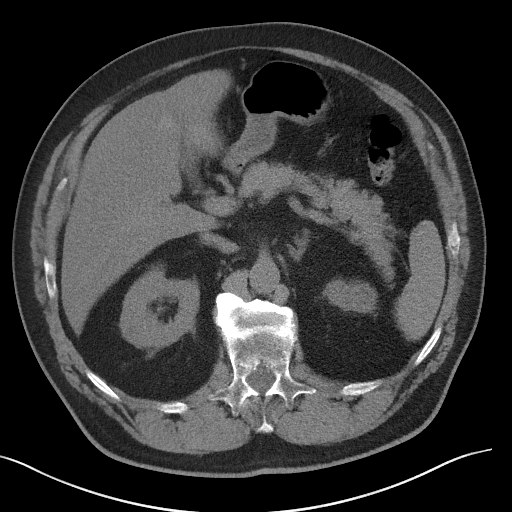
[im 79/91  soft-tissue]
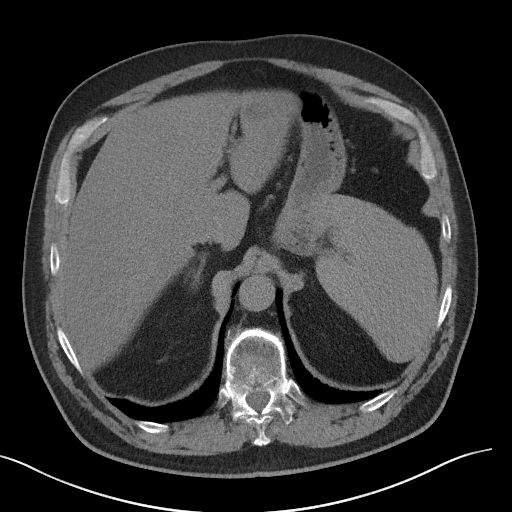
[im 87/91  soft-tissue]
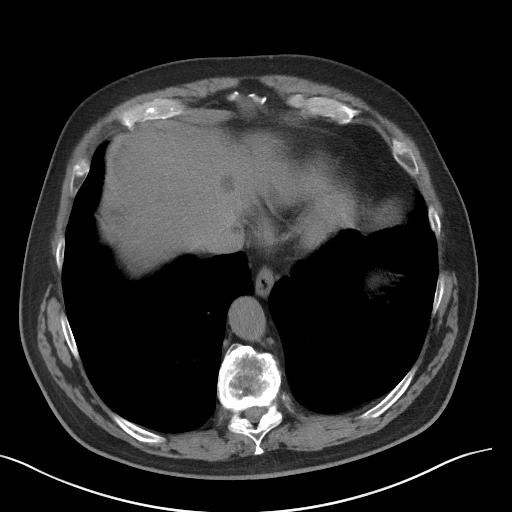

[Series 5: coronal · coronal · 0.89mm/px · 3 of 177 slices shown]
[im 59/177  soft-tissue]
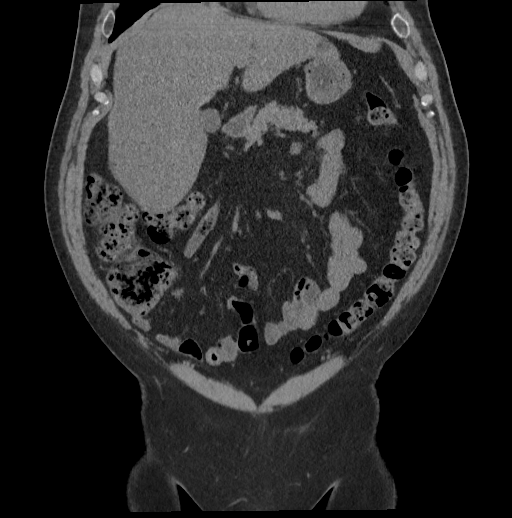
[im 79/177  soft-tissue]
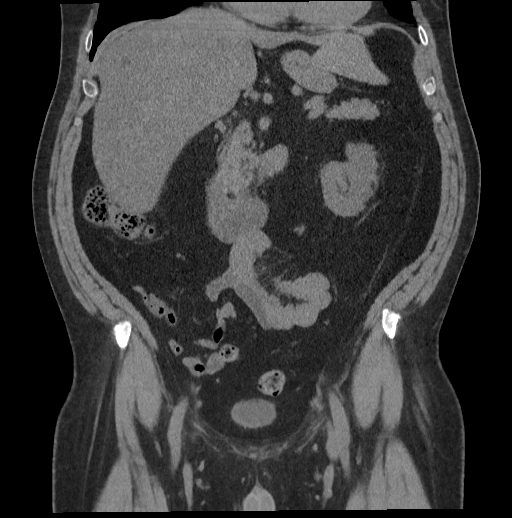
[im 98/177  soft-tissue]
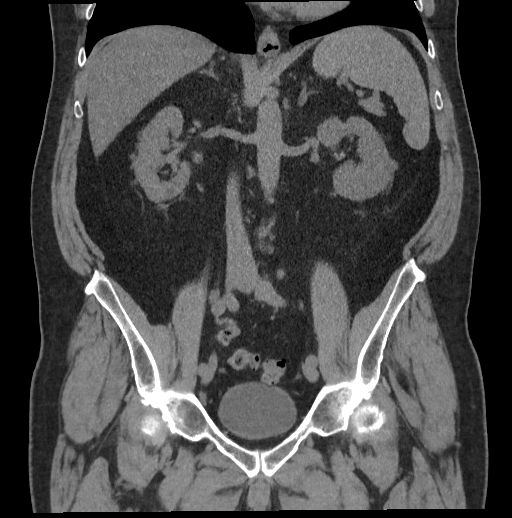

[17 of 46 positions shown; findings below may reference images not displayed]

FINDINGS: Lower chest: Lung bases demonstrate no acute consolidation or
effusion. Heart size within normal limits.

Hepatobiliary: Multiple hypodensities in the liver, larger lesions
compatible with cysts. Subcentimeter lesions too small to further
characterize. No calcified gallstone or biliary dilatation

Pancreas: Unremarkable. No pancreatic ductal dilatation or
surrounding inflammatory changes.

Spleen: Normal in size without focal abnormality.

Adrenals/Urinary Tract: Adrenal glands normal. No hydronephrosis.
Cyst upper pole right kidney. Hyperdense nodules in the mid left
kidney, subcentimeter. Bladder normal

Stomach/Bowel: Stomach is within normal limits. Appendix appears
normal. No evidence of bowel wall thickening, distention, or
inflammatory changes. Scattered diverticular disease of the sigmoid
colon without acute inflammatory change.

Vascular/Lymphatic: Nonaneurysmal aorta. Mild aortic
atherosclerosis. No significant adenopathy.

Reproductive: Enlarged prostate gland with mass effect on the
bladder

Other: Negative for free air or free fluid. Fat within the inguinal
canals. Small fat containing periumbilical hernia.

Musculoskeletal: Degenerative changes of the spine. No acute or
suspicious abnormality.
IMPRESSION: 1. Negative for hydronephrosis or ureteral stone.
2. Sigmoid colon diverticular disease without acute inflammatory
process. Negative for appendicitis.
3. Hyperdense nodules in the mid left kidney possibly hemorrhagic or
proteinaceous cysts.
4. Enlarged prostate gland

## 2019-07-29 ENCOUNTER — Encounter: Payer: Self-pay | Admitting: Family Medicine

## 2019-07-29 ENCOUNTER — Ambulatory Visit (INDEPENDENT_AMBULATORY_CARE_PROVIDER_SITE_OTHER): Payer: BC Managed Care – PPO | Admitting: Family Medicine

## 2019-07-29 ENCOUNTER — Other Ambulatory Visit: Payer: Self-pay

## 2019-07-29 VITALS — BP 124/72 | HR 65 | Temp 98.9°F | Ht 73.0 in | Wt 258.0 lb

## 2019-07-29 DIAGNOSIS — L570 Actinic keratosis: Secondary | ICD-10-CM | POA: Diagnosis not present

## 2019-07-29 NOTE — Progress Notes (Signed)
Phone [phone] In person visit   Subjective:   Darrell Griffin is a 74 y.o. year old very pleasant male patient who presents for/with See problem oriented charting Chief Complaint  Patient presents with  . Rash   This visit occurred during the SARS-CoV-2 public health emergency.  Safety protocols were in place, including screening questions prior to the visit, additional usage of staff PPE, and extensive cleaning of exam room while observing appropriate contact time as indicated for disinfecting solutions.   Past Medical History-  Patient Active Problem List   Diagnosis Date Noted  . Hypothyroidism 10/22/2005    Priority: Medium  . Essential hypertension 10/22/2005    Priority: Medium  . Obesity, Class I, BMI 30-34.9 12/25/2011    Priority: Low  . Pain of left lateral upper thigh 11/17/2018  . History of adenomatous polyp of colon 04/01/2018    Medications- reviewed and updated Current Outpatient Medications  Medication Sig Dispense Refill  . hydrochlorothiazide (MICROZIDE) 12.5 MG capsule TAKE 1 CAPSULE BY MOUTH EVERY DAY 90 capsule 3  . levothyroxine (SYNTHROID) 200 MCG tablet Take along with 25 mcg 90 tablet 3  . levothyroxine (SYNTHROID) 25 MCG tablet TAKE 1 TABLET BY MOUTH EVERY DAY 90 tablet 1  . lisinopril (ZESTRIL) 40 MG tablet TAKE 1 TABLET BY MOUTH DAILY 90 tablet 3   No current facility-administered medications for this visit.     Objective:  BP 124/72   Pulse 65   Temp 98.9 F (37.2 C) (Temporal)   Ht 6\' 1"  (1.854 m)   Wt 258 lb (117 kg)   SpO2 97%   BMI 34.04 kg/m   CV: RRR  Lungs: nonlabored, normal respiratory rate Abdomen: soft/nondistended  Skin: warm, dry, patient with several seborrheic keratosis-waxy slightly raised lesions on right upper cheek-adjacent to 1 of these is a 1 to 2 mm area of scale overlying erythematous skin concerning for actinic keratoses. -Patient also has 2 other small areas on the right cheek with slight erythema and  slight scale low smaller than above lesion  Procedure note: Benefits and risks verbally discussed with patient. Risks include scarring/bleeding/local skin irritation. Potential benefits include removal of precancerous lesion 10  second freeze thaw cycle of cryotherapy performed  with liquid nitrogen to actinic keratosis on right cheek  No complications.  Patient tolerated the procedure well other than mild pain. Gave handout on cryotherapy    Assessment and Plan   # Red spots  S:on right side of face around eye and cheek. Notice about two weeks ago. Has not put any cream or lotions on it. Was itchy at times. Has improved some in the last few days.  A/P: Patient appears to have an actinic keratosis adjacent to a seborrheic keratosis on the right cheek-this is the main reason of concern.  We completed cryotherapy as above for treatment.  We discussed potential for transformation into skin cancer in regards to actinic keratoses-I do think a seborrheic keratoses closest to the area we treated may also come off but we discussed this is a benign/noncancerous area  He has another area approximately 1 to 2 cm below this that looks like may be an early actinic keratosis-after initial cryotherapy he did not want to complete another round at this time due to discomfort  Closure to the ear has a very small erythematous area with perhaps fine scale-possible actinic keratosis but once again wanted to hold off on treatment at this time  Recommended follow up: No follow-ups on file.  Future Appointments  Date Time Provider [place]  10/20/2019  9:40 AM Yong Channel, Brayton Mars, MD [facility]    Lab/Order associations:   ICD-10-CM   1. Actinic keratosis  L57.0     Time Spent: 16 minutes of total time (4:37 PM- 4:54 PM with cryotherapy treatment lasting less than a minute) was spent on the date of the encounter performing the following actions: chart review prior to seeing the patient, obtaining  history, performing a medically necessary exam, counseling on the treatment plan, placing orders, and documenting in our EHR.   Return precautions advised.  Garret Reddish, MD

## 2019-07-29 NOTE — Patient Instructions (Addendum)
Cryosurgery for Skin Conditions, Care After These instructions give you information on caring for yourself after your procedure. Your doctor may also give you more specific instructions. Call your doctor if you have any problems or questions after your procedure. Follow these instructions at home: Caring for the treated area   Follow instructions from your doctor about how to take care of the treated area. Make sure you: ? Keep the area covered with a bandage (dressing) until it heals, or for as long as told by your doctor. ? Wash your hands with soap and water before you change your bandage. If you do not have soap and water, use hand sanitizer. ? Change your bandage as told by your doctor. ? Keep the bandage and the treated area clean and dry. If the bandage gets wet, change it right away. ? Clean the treated area with soap and water.  Check the treated area every day for signs of infection. Check for: ? More redness, swelling, or pain. ? More fluid or blood. ? Warmth. ? Pus or a bad smell. General instructions  Do not pick at your blister. Do not try to break it open. This can cause infection and scarring.  Do not put any medicine, cream, or lotion on the treated area unless told by your doctor.  Take over-the-counter and prescription medicines only as told by your doctor.  Keep all follow-up visits as told by your doctor. This is important. Contact a doctor if:  You have more redness, swelling, or pain around the treated area.  You have more fluid or blood coming from the treated area.  The treated area feels warm to the touch.  You have pus or a bad smell coming from the treated area.  Your blister gets large and painful. Get help right away if:  You have a fever and have redness spreading from the treated area. Summary  You should keep the treated area and your bandage clean and dry.  Check the treated area every day for signs of infection. Signs include fluid, pus,  warmth, or having more redness, swelling, or pain.  Do not pick at your blister. Do not try to break it open. This information is not intended to replace advice given to you by your health care provider. Make sure you discuss any questions you have with your health care provider. Document Revised: 12/20/2016 Document Reviewed: 11/27/2015 Elsevier Patient Education  Darrell Griffin

## 2019-08-23 ENCOUNTER — Other Ambulatory Visit: Payer: Self-pay | Admitting: Family Medicine

## 2019-09-17 IMAGING — US US ABDOMEN COMPLETE
1 series · 13 of 25 positions shown · non-contrast
Comparison: CT scan of February 10, 2018.

CLINICAL DATA: Right upper quadrant abdominal pain.

EXAM:
ABDOMEN ULTRASOUND COMPLETE

[Series 1: us abdomen complete · 0.23mm/px · 13 of 99 slices shown]
[im 1/99]
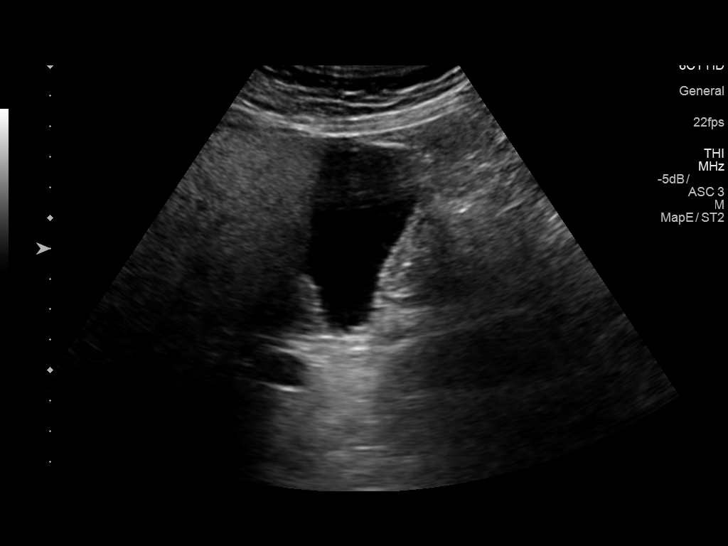
[im 9/99]
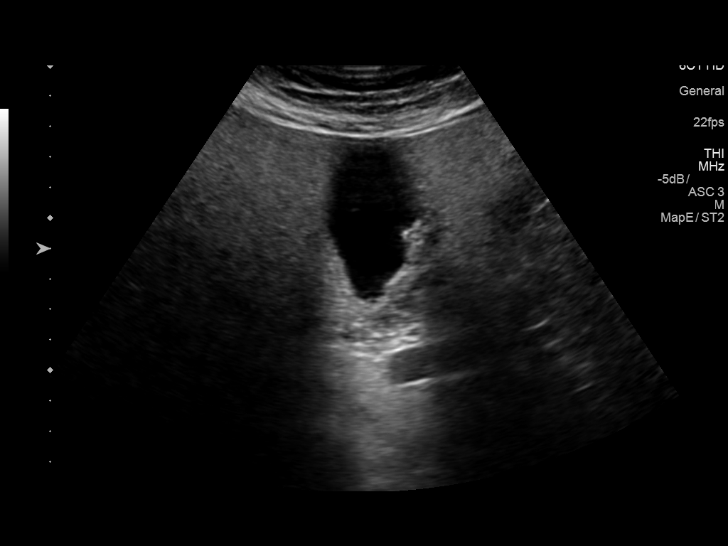
[im 17/99]
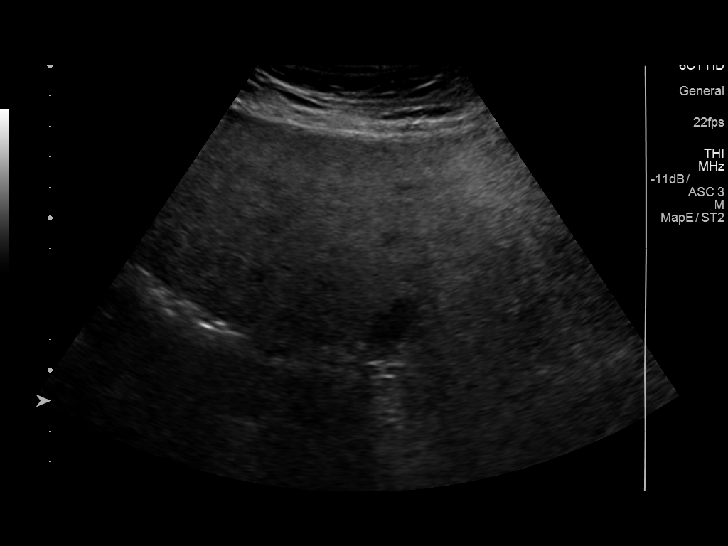
[im 25/99]
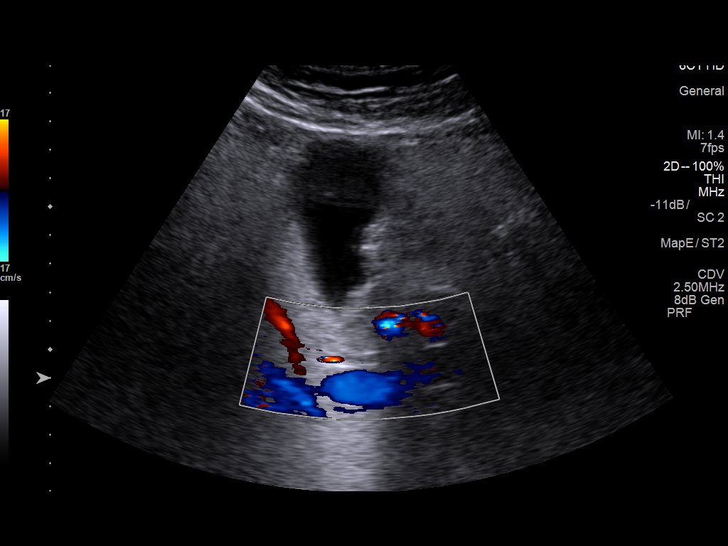
[im 33/99]
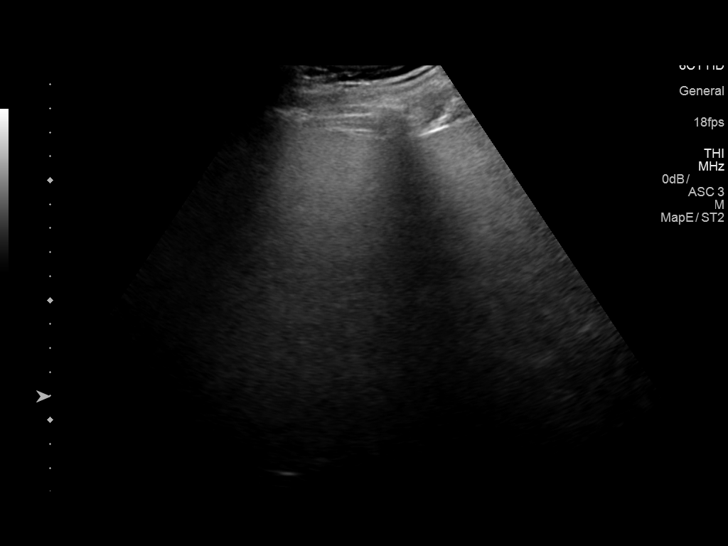
[im 41/99]
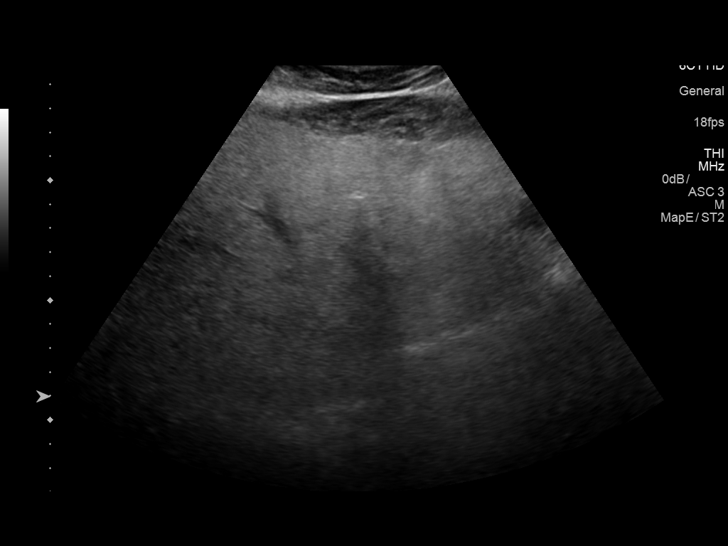
[im 50/99]
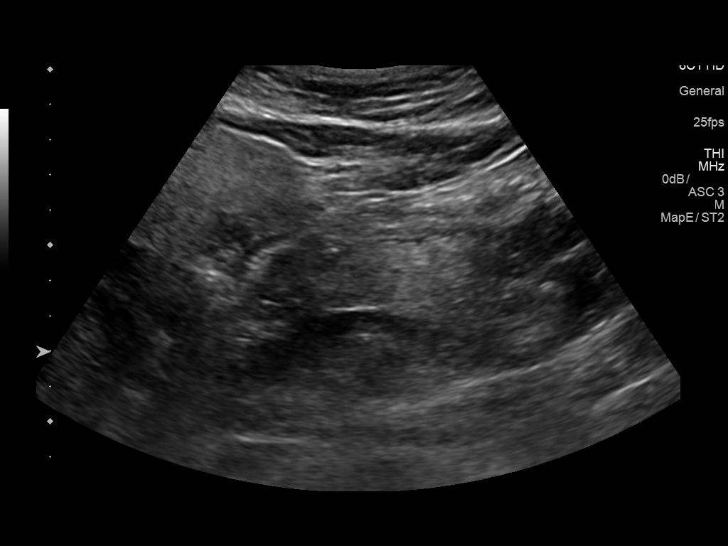
[im 58/99]
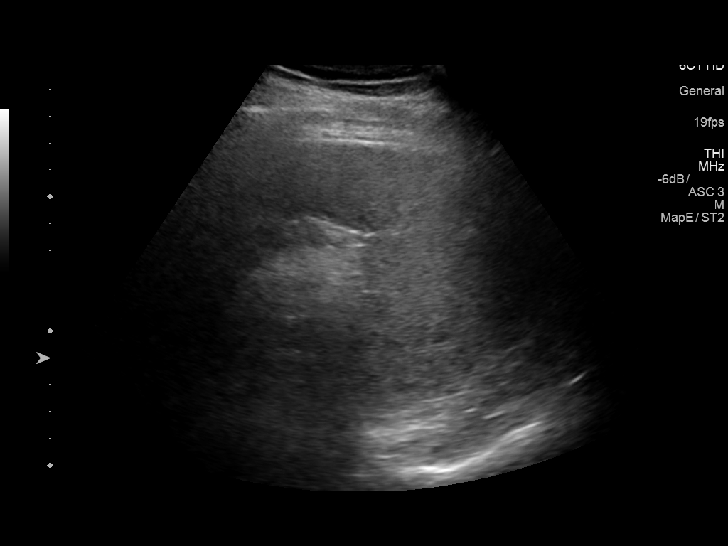
[im 66/99]
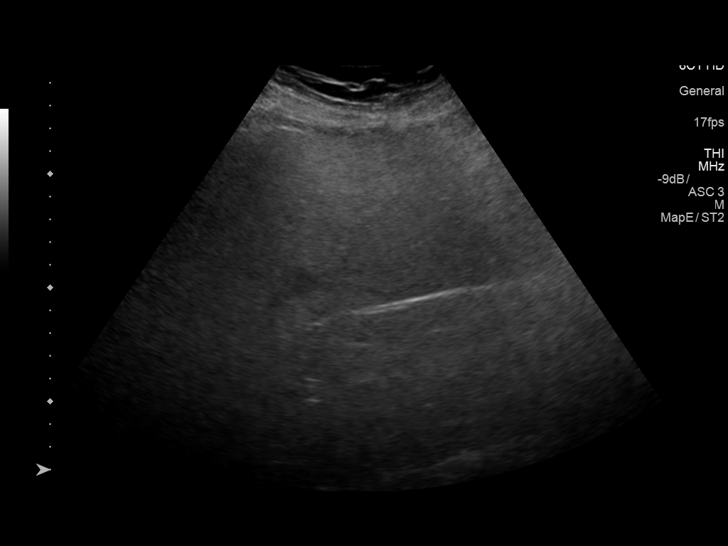
[im 74/99]
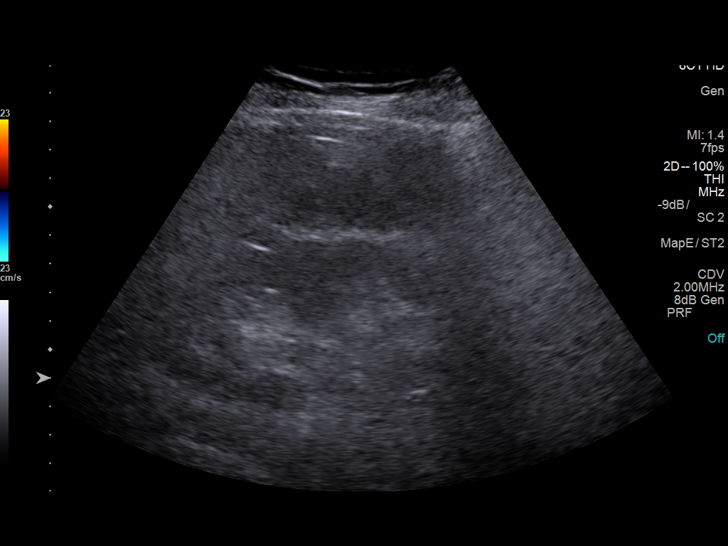
[im 82/99]
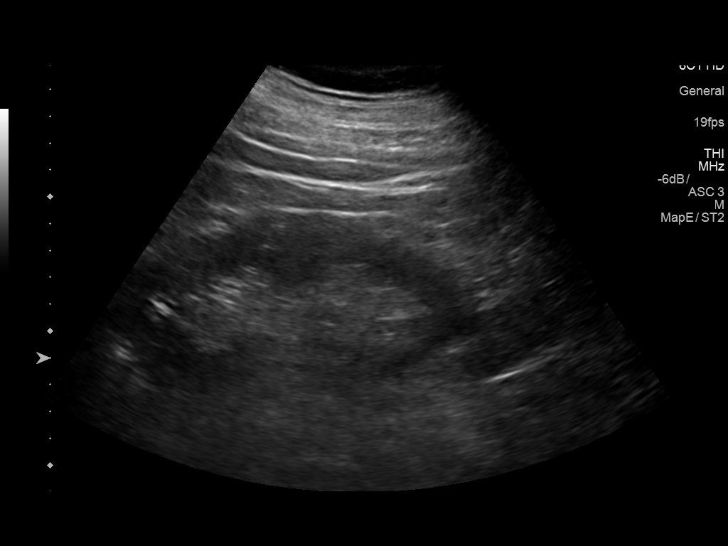
[im 90/99]
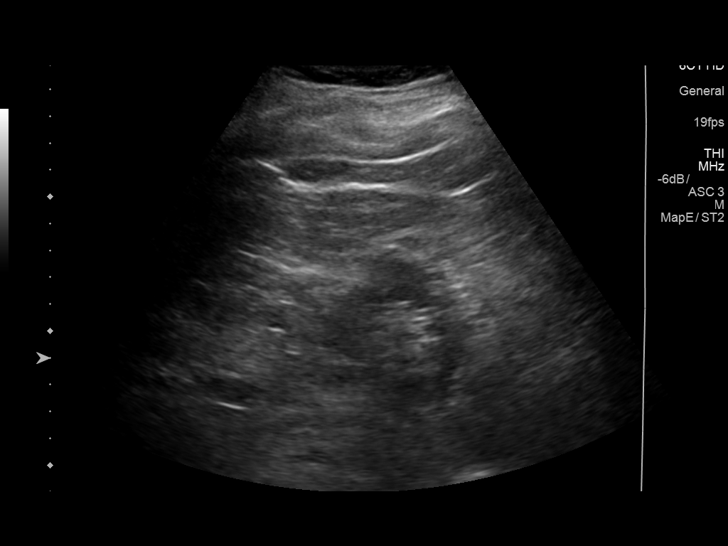
[im 99/99]
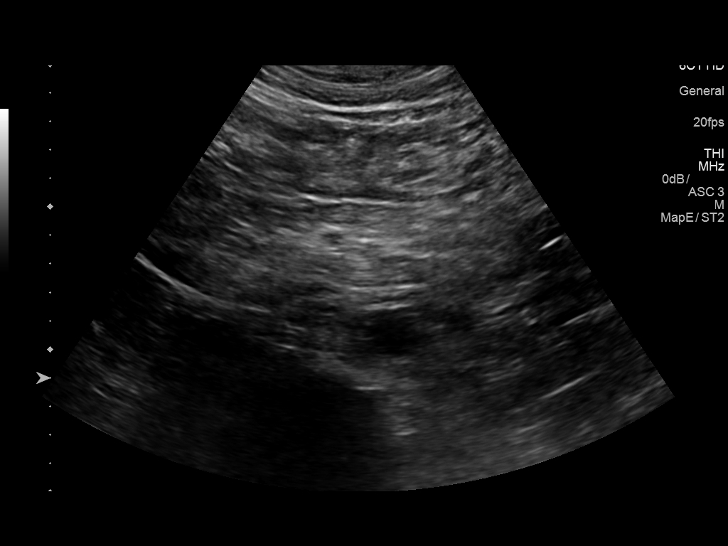

[13 of 25 positions shown; findings below may reference images not displayed]

FINDINGS: Gallbladder: Multiple gallstones are noted without gallbladder wall
thickening or pericholecystic fluid. No sonographic Murphy's sign is
noted. Largest calculus measures 1 cm.

Common bile duct: Diameter: 4 mm which is within normal limits.

Liver: Increased echogenicity of hepatic parenchyma is noted
suggesting fatty infiltration or other diffuse hepatocellular
disease. 1.9 cm left hepatic cyst is noted. Portal vein is patent on
color Doppler imaging with normal direction of blood flow towards
the liver.

IVC: No abnormality visualized.

Pancreas: Visualized portion unremarkable.

Spleen: Size and appearance within normal limits.

Right Kidney: Length: 12.5 cm. Echogenicity within normal limits. No
mass or hydronephrosis visualized.

Left Kidney: Length: 13.6 cm. Echogenicity within normal limits. No
mass or hydronephrosis visualized.

Abdominal aorta: No aneurysm visualized.

Other findings: None.
IMPRESSION: Cholelithiasis without definite evidence of cholecystitis or biliary
dilatation.

Increased echogenicity of hepatic parenchyma is noted suggesting
fatty infiltration or other diffuse hepatocellular disease. Small
left hepatic cyst is noted.

## 2019-10-15 ENCOUNTER — Encounter: Payer: BC Managed Care – PPO | Admitting: Family Medicine

## 2019-10-20 ENCOUNTER — Other Ambulatory Visit: Payer: Self-pay

## 2019-10-20 ENCOUNTER — Encounter: Payer: Self-pay | Admitting: Family Medicine

## 2019-10-20 ENCOUNTER — Ambulatory Visit (INDEPENDENT_AMBULATORY_CARE_PROVIDER_SITE_OTHER): Payer: BC Managed Care – PPO | Admitting: Family Medicine

## 2019-10-20 VITALS — BP 124/62 | HR 57 | Temp 97.7°F | Resp 18 | Ht 73.0 in | Wt 254.8 lb

## 2019-10-20 DIAGNOSIS — Z0000 Encounter for general adult medical examination without abnormal findings: Secondary | ICD-10-CM

## 2019-10-20 DIAGNOSIS — I10 Essential (primary) hypertension: Secondary | ICD-10-CM

## 2019-10-20 DIAGNOSIS — Z23 Encounter for immunization: Secondary | ICD-10-CM | POA: Diagnosis not present

## 2019-10-20 DIAGNOSIS — E039 Hypothyroidism, unspecified: Secondary | ICD-10-CM | POA: Diagnosis not present

## 2019-10-20 DIAGNOSIS — E785 Hyperlipidemia, unspecified: Secondary | ICD-10-CM

## 2019-10-20 NOTE — Patient Instructions (Addendum)
Please stop by lab before you go If you have mychart- we will send your results within 3 business days of [place] receiving them.  If you do not have mychart- we will call you about results within 5 business days of [place] receiving them.  *please note we are currently using [facility] which has a longer processing time than [facility] typically so labs may not come back as quickly as in the past *please also note that you will see labs on mychart as soon as they post. I will later go in and write notes on them- will say "notes from Dr. Yong Channel"  Health Maintenance Due  Topic Date Due  . INFLUENZA VACCINE high dose flu shot today 08/22/2019   When things are a little better get you back in with dentist and eye doctor.   Omron series 3 is reasonable cuff  Lets set a goal of at least 5-10 lbs off by next visit. 5 servings of veggies/fruits per day- at least 3 veggies. 150 minutes exercise per week- whatever you enjoy doing

## 2019-10-20 NOTE — Addendum Note (Signed)
Addended by: Thomes Cake on: 10/20/2019 10:59 AM   Modules accepted: Orders

## 2019-10-20 NOTE — Progress Notes (Signed)
Phone: [phone]   Subjective:  Patient presents today for their annual physical. Chief complaint-noted.   See problem oriented charting- ROS- full  review of systems was completed and negative  except for: urinary frequency  The following were reviewed and entered/updated in epic: Past Medical History:  Diagnosis Date   GERD (gastroesophageal reflux disease)    Hypertension    Hypothyroidism    Patient Active Problem List   Diagnosis Date Noted   Hypothyroidism 10/22/2005    Priority: Medium   Essential hypertension 10/22/2005    Priority: Medium   Obesity, Class I, BMI 30-34.9 12/25/2011    Priority: Low   Dyslipidemia 10/20/2019   Pain of left lateral upper thigh 11/17/2018   History of adenomatous polyp of colon 04/01/2018   Past Surgical History:  Procedure Laterality Date   THYROIDECTOMY     "cold spot", ultimately benign    Family History  Problem Relation Age of Onset   Seizures Father        past age 77   Breast cancer Mother            Hypertension Mother    Hypertension Brother    Colon cancer Neg Hx    Esophageal cancer Neg Hx    Rectal cancer Neg Hx    Stomach cancer Neg Hx     Medications- reviewed and updated Current Outpatient Medications  Medication Sig Dispense Refill   hydrochlorothiazide (MICROZIDE) 12.5 MG capsule TAKE 1 CAPSULE BY MOUTH EVERY DAY 90 capsule 3   levothyroxine (SYNTHROID) 200 MCG tablet Take along with 25 mcg 90 tablet 3   levothyroxine (SYNTHROID) 25 MCG tablet TAKE 1 TABLET BY MOUTH EVERY DAY 90 tablet 1   lisinopril (ZESTRIL) 40 MG tablet TAKE 1 TABLET BY MOUTH DAILY 90 tablet 3   No current facility-administered medications for this visit.    Allergies-reviewed and updated No Known Allergies  Social History   Social History Narrative   Married. Daughter 7 in 2016. 5 granddaughters- 2 are stepdaughters.       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 Oropharynx normal Neck: no thyromegaly CV: RRR no murmurs rubs or gallops Lungs: CTAB no crackles, wheeze, rhonchi Abdomen: soft/nontender/nondistended/normal bowel sounds. No rebound or guarding.  Ext: no edema Skin: warm, dry Neuro: grossly normal, moves all extremities, PERRLA    Assessment and Plan  74 y.o. male presenting for annual physical.  Health Maintenance counseling: 1. Anticipatory guidance: Patient counseled regarding regular dental exams - advised q6 months, eye exams - advised yearly,  avoiding smoking and second hand smoke, limiting alcohol to 2 beverages per day- doesn't drink.   2. Risk factor reduction:  Advised patient of need for regular exercise and diet rich and fruits and vegetables to reduce risk of heart attack and stroke. Exercise- recommended restarting walking long distance- doing 10 mins with dog right now and a lot of stopping. Diet-states was inspired by this visit and trying to improve diet. could increase water intake.  Wt Readings from Last 3  Encounters:  10/20/19 254 lb 12.8 oz (115.6 kg)  07/29/19 258 lb (117 kg)  04/27/19 257 lb (116.6 kg)  3. Immunizations/screenings/ancillary studies-flu shot today high-dose Immunization History  Administered Date(s) Administered   Fluad Quad(high Dose 65+) 10/12/2018   Influenza Split 12/25/2011   Influenza, High Dose Seasonal PF 02/28/2017   Moderna SARS-COVID-2 Vaccination 03/04/2019, 04/06/2019   Pneumococcal Conjugate-13 06/24/2014   Pneumococcal Polysaccharide-23 02/28/2017   Td 10/14/2008   Zoster 12/25/2011   Zoster Recombinat (Shingrix) 04/27/2019, 06/29/2019   4. Prostate cancer screening- low risk PSA trend  last year-we have opted to discontinue screening per [place] PTF guidelines.  Nocturia stable 0-3 times a night Lab Results  Component Value Date   PSA 3.34 10/12/2018   PSA 3.19 06/29/2014   PSA 3.21 12/25/2011   5. Colon cancer screening - history adenomatous polyp March 2020 with 3-year repeat planned 6. Skin cancer screening-no dermatologist- we did cryotherapy on AK last visit on cheek which has resolved- has a smaller area of possible AK but also right where mask rubs- monitor for now. advised regular sunscreen use. Denies worrisome, changing, or new skin lesions.  7. Non smoker/never smoker 8. STD screening - monogamous with wife  Status of chronic or acute concerns   #hypertension S: medication: Lisinopril 40Mg , HCTZ 12.5Mg  Home readings #s: not testing recently BP Readings from Last 3 Encounters:  10/20/19 124/62  07/29/19 124/72  04/27/19 126/74  A/P: Stable. Continue current medications.   #hypothyroidism S: compliant On thyroid medication- levothyroxine 25Mcg and 200Mcg Lab Results  Component Value Date   TSH 0.72 04/27/2019   A/P:Well-controlled on last check-update TSH today  #Dyslipidemia with HDL under 40 S: Medication:none  Lab Results  Component Value Date   CHOL 147 10/12/2018   HDL 37.00 (L) 10/12/2018   LDLCALC 85 10/12/2018   TRIG 123.0 10/12/2018   CHOLHDL 4 10/12/2018   A/P: 10-year ASCVD risk elevated at 22.3% based off last year's labs-we will update lipid panel today and if risk above 20% patient would like to focus on healthy eating/regular exercise/weight loss and then recheck next year. No chest pain or SOB   Recommended follow up: Return in about 6 months (around 04/18/2020) for follow up- or sooner if needed. primarily weight/lifestyle check.  Lab/Order associations:Non fasting   ICD-10-CM   1. Preventative health care  Z00.00 Lipid panel    CBC with Differential/Platelet    COMPLETE METABOLIC PANEL WITH GFR    TSH  2. Essential hypertension   I10 Lipid panel    CBC with Differential/Platelet    COMPLETE METABOLIC PANEL WITH GFR  3. Hypothyroidism, unspecified type  E03.9 TSH  4. Dyslipidemia  E78.5     Return precautions advised.  Garret Reddish, MD

## 2019-10-21 LAB — CBC WITH DIFFERENTIAL/PLATELET
Absolute Monocytes: 664 cells/uL (ref 200–950)
Basophils Absolute: 152 cells/uL (ref 0–200)
Basophils Relative: 1.9 %
Eosinophils Absolute: 208 cells/uL (ref 15–500)
Eosinophils Relative: 2.6 %
HCT: 45.3 % (ref 38.5–50.0)
Hemoglobin: 15.2 g/dL (ref 13.2–17.1)
Lymphs Abs: 2232 cells/uL (ref 850–3900)
MCH: 29.3 pg (ref 27.0–33.0)
MCHC: 33.6 g/dL (ref 32.0–36.0)
MCV: 87.5 fL (ref 80.0–100.0)
MPV: 10.6 fL (ref 7.5–12.5)
Monocytes Relative: 8.3 %
Neutro Abs: 4744 cells/uL (ref 1500–7800)
Neutrophils Relative %: 59.3 %
Platelets: 192 10*3/uL (ref 140–400)
RBC: 5.18 10*6/uL (ref 4.20–5.80)
RDW: 12.4 % (ref 11.0–15.0)
Total Lymphocyte: 27.9 %
WBC: 8 10*3/uL (ref 3.8–10.8)

## 2019-10-21 LAB — COMPLETE METABOLIC PANEL WITH GFR
AG Ratio: 2.4 (calc) (ref 1.0–2.5)
ALT: 38 U/L (ref 9–46)
AST: 31 U/L (ref 10–35)
Albumin: 4.5 g/dL (ref 3.6–5.1)
Alkaline phosphatase (APISO): 36 U/L (ref 35–144)
BUN: 13 mg/dL (ref 7–25)
CO2: 27 mmol/L (ref 20–32)
Calcium: 8.9 mg/dL (ref 8.6–10.3)
Chloride: 106 mmol/L (ref 98–110)
Creat: 0.99 mg/dL (ref 0.70–1.18)
GFR, Est African American: 88 mL/min/{1.73_m2} (ref >=60)
GFR, Est Non African American: 76 mL/min/{1.73_m2} (ref >=60)
Globulin: 1.9 g/dL (calc) (ref 1.9–3.7)
Glucose, Bld: 94 mg/dL (ref 65–99)
Potassium: 4 mmol/L (ref 3.5–5.3)
Sodium: 141 mmol/L (ref 135–146)
Total Bilirubin: 0.9 mg/dL (ref 0.2–1.2)
Total Protein: 6.4 g/dL (ref 6.1–8.1)

## 2019-10-21 LAB — LIPID PANEL
Cholesterol: 144 mg/dL (ref <200)
HDL: 36 mg/dL — ABNORMAL LOW (ref >=40)
LDL Cholesterol (Calc): 83 mg/dL (calc)
Non-HDL Cholesterol (Calc): 108 mg/dL (calc) (ref <130)
Total CHOL/HDL Ratio: 4 (calc) (ref <5.0)
Triglycerides: 145 mg/dL (ref <150)

## 2019-10-21 LAB — TSH: TSH: 0.67 mIU/L (ref 0.40–4.50)

## 2019-11-18 ENCOUNTER — Other Ambulatory Visit: Payer: Self-pay | Admitting: Family Medicine

## 2019-11-24 ENCOUNTER — Other Ambulatory Visit: Payer: Self-pay | Admitting: Family Medicine

## 2020-01-28 ENCOUNTER — Other Ambulatory Visit: Payer: Self-pay | Admitting: Family Medicine

## 2020-04-26 NOTE — Progress Notes (Signed)
Phone [phone] In person visit   Subjective:   Darrell Griffin is a 75 y.o. year old very pleasant male patient who presents for/with See problem oriented charting Chief Complaint  Patient presents with  . Hyperlipidemia  . Spots    Patient wants you to look at "spots" on his face  . Immunizations    Patient wants to discuss the booster with you .    This visit occurred during the SARS-CoV-2 public health emergency.  Safety protocols were in place, including screening questions prior to the visit, additional usage of staff PPE, and extensive cleaning of exam room while observing appropriate contact time as indicated for disinfecting solutions.   Past Medical History-  Patient Active Problem List   Diagnosis Date Noted  . Dyslipidemia 10/20/2019    Priority: Medium  . Hypothyroidism 10/22/2005    Priority: Medium  . Essential hypertension 10/22/2005    Priority: Medium  . History of adenomatous polyp of colon 04/01/2018    Priority: Low  . Obesity, Class I, BMI 30-34.9 12/25/2011    Priority: Low  . Pain of left lateral upper thigh 11/17/2018    Medications- reviewed and updated Current Outpatient Medications  Medication Sig Dispense Refill  . hydrochlorothiazide (MICROZIDE) 12.5 MG capsule TAKE 1 CAPSULE BY MOUTH EVERY DAY 90 capsule 3  . levothyroxine (SYNTHROID) 200 MCG tablet TAKE 1 TABLET BY MOUTH EVERY DAY WITH 25MCG 90 tablet 3  . levothyroxine (SYNTHROID) 25 MCG tablet TAKE 1 TABLET BY MOUTH EVERY DAY 90 tablet 1  . lisinopril (ZESTRIL) 40 MG tablet TAKE 1 TABLET BY MOUTH EVERY DAY 90 tablet 3   No current facility-administered medications for this visit.     Objective:  BP 126/60   Pulse (!) 54   Temp 98.2 F (36.8 C) (Temporal)   Ht 6\' 1"  (1.854 m)   Wt 212 lb 9.6 oz (96.4 kg)   SpO2 98%   BMI 28.05 kg/m  Gen: NAD, resting comfortably CV: RRR no murmurs rubs or gallops Lungs: CTAB no crackles, wheeze, rhonchi Abdomen:  soft/nontender/nondistended/normal bowel sounds. No rebound or guarding.  Ext: no edema Skin: warm, dry, erythematous scaly lesion   Procedure note: Benefits and risks verbally discussed with patient 6 second freeze thaw cycle of cryotherapy performed  with liquid nitrogen to right cheek actinic keratosis No complications.  Patient tolerated the procedure well other than mild pain. Gave handout on this from [facility] and we reviewed this content [email or web address]     Assessment and Plan   #hyperlipidemia S: Medication:none    Patient is down 42 lbs- has reduced portion sizes substantially and less snacking. BMi has gone form obesity range to overweight range. His goal is 205 on home scales- he was 206 this AM. He may push a few lbs lower depending on how he feels.   Some exercise- walking dog once or twice a day 10-15 minutes. Considering going to exercise room at apartments  No chest pain or shortness of breath Lab Results  Component Value Date   CHOL 144 10/20/2019   HDL 36 (L) 10/20/2019   LDLCALC 83 10/20/2019   TRIG 145 10/20/2019   CHOLHDL 4.0 10/20/2019   A/P: hoping for improvement- we opted to wait until physical to recheck this and give him more time for continued changes- he has done a phenomenal job. He has eliminated obesity from his problem list even now in overweight range  #hypertension S: medication: hctz 12.5 mg and lisinopril 40mg  Home readings #  s: has home cuff- usually similar #s at home BP Readings from Last 3 Encounters:  04/27/20 126/60  10/20/19 124/62  07/29/19 124/72  A/P: blood pressure well controlled-with weight loss will need to keep eye on this- potentially could stop hctz and even offered this today but he declines  #hypothyroidism S: compliant On thyroid medication-levothyroxine 225 mcg  Lab Results  Component Value Date   TSH 0.67 10/20/2019  A/P: has been well controlled-  offered to check today with weight loss (which is intentional though) but prefers to wait until next visit. Continue current meds for now.   #HM- has not had 3rd shot/4th booster Immunization History  Administered Date(s) Administered  . Fluad Quad(high Dose 65+) 10/12/2018, 10/20/2019  . Influenza Split 12/25/2011  . Influenza, High Dose Seasonal PF 02/28/2017  . Moderna Sars-Covid-2 Vaccination 03/04/2019, 04/06/2019  . Pneumococcal Conjugate-13 06/24/2014  . Pneumococcal Polysaccharide-23 02/28/2017  . Td 10/14/2008  . Zoster 12/25/2011  . Zoster Recombinat (Shingrix) 04/27/2019, 06/29/2019   #Actinic keratosis-last July we froze an AK near his seborrheic keratosis-this thankfully has resolved.  He has a new spot under the right eye corner erythematous and scaly concerning for actinic keratosis-cryotherapy was completed on this today.  Recommended follow up:  Return in about 6 months (around 10/27/2020) for physical or sooner if needed. No future appointments.  Lab/Order associations:   ICD-10-CM   1. Dyslipidemia  E78.5   2. Hypothyroidism, unspecified type  E03.9   3. Essential hypertension  I10   4. Actinic keratosis  L57.0    Return precautions advised.  Garret Reddish, MD

## 2020-04-26 NOTE — Patient Instructions (Addendum)
  Health Maintenance Due  Topic Date Due  . TETANUS/TDAP - today 10/15/2018  . COVID-19 Vaccine - would recommend 3rd shot at pharmacy 05/04/2019   congrats on your weight loss!   Cryo today x2- if this does not resolve- we need to refer to dermatology  Recommended follow up: Return in about 6 months (around 10/27/2020) for physical or sooner if needed.

## 2020-04-27 ENCOUNTER — Encounter: Payer: Self-pay | Admitting: Family Medicine

## 2020-04-27 ENCOUNTER — Other Ambulatory Visit: Payer: Self-pay

## 2020-04-27 ENCOUNTER — Ambulatory Visit: Payer: BC Managed Care – PPO | Admitting: Family Medicine

## 2020-04-27 VITALS — BP 126/60 | HR 54 | Temp 98.2°F | Ht 73.0 in | Wt 212.6 lb

## 2020-04-27 DIAGNOSIS — I10 Essential (primary) hypertension: Secondary | ICD-10-CM

## 2020-04-27 DIAGNOSIS — E039 Hypothyroidism, unspecified: Secondary | ICD-10-CM | POA: Diagnosis not present

## 2020-04-27 DIAGNOSIS — E785 Hyperlipidemia, unspecified: Secondary | ICD-10-CM | POA: Diagnosis not present

## 2020-04-27 DIAGNOSIS — L570 Actinic keratosis: Secondary | ICD-10-CM

## 2020-04-27 DIAGNOSIS — Z23 Encounter for immunization: Secondary | ICD-10-CM

## 2020-04-27 NOTE — Addendum Note (Signed)
Addended by: Gean Birchwood on: 04/27/2020 05:17 PM   Modules accepted: Orders

## 2020-04-29 ENCOUNTER — Other Ambulatory Visit: Payer: Self-pay | Admitting: Family Medicine

## 2020-10-20 NOTE — Progress Notes (Signed)
Phone: [phone]   Subjective:  Patient presents today for their annual physical. Chief complaint-noted.   See problem oriented charting- ROS- full  review of systems was completed and negative per full ROS sheet  The following were reviewed and entered/updated in epic: Past Medical History:  Diagnosis Date   GERD (gastroesophageal reflux disease)    Hypertension    Hypothyroidism    Patient Active Problem List   Diagnosis Date Noted   Dyslipidemia 10/20/2019    Priority: 2.   Hypothyroidism 10/22/2005    Priority: 2.   Essential hypertension 10/22/2005    Priority: 2.   History of adenomatous polyp of colon 04/01/2018    Priority: 3.   Obesity, Class I, BMI 30-34.9 12/25/2011    Priority: 3.   Pain of left lateral upper thigh 11/17/2018   Past Surgical History:  Procedure Laterality Date   THYROIDECTOMY     "cold spot", ultimately benign    Family History  Problem Relation Age of Onset   Seizures Father        past age 25   Breast cancer Mother            Hypertension Mother    Hypertension Brother    Colon cancer Neg Hx    Esophageal cancer Neg Hx    Rectal cancer Neg Hx    Stomach cancer Neg Hx     Medications- reviewed and updated Current Outpatient Medications  Medication Sig Dispense Refill   levothyroxine (SYNTHROID) 200 MCG tablet TAKE 1 TABLET BY MOUTH EVERY DAY WITH 25MCG 90 tablet 3   levothyroxine (SYNTHROID) 25 MCG tablet TAKE 1 TABLET BY MOUTH EVERY DAY 90 tablet 1   lisinopril (ZESTRIL) 40 MG tablet TAKE 1 TABLET BY MOUTH EVERY DAY 90 tablet 3   No current facility-administered medications for this visit.    Allergies-reviewed and updated No Known Allergies  Social History   Social History Narrative   Married. Daughter 84 in 2016. 5 granddaughters- 2 are stepdaughters.       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 Oropharynx normal Neck: no thyromegaly CV: RRR no murmurs rubs or gallops Lungs: CTAB no crackles, wheeze, rhonchi Abdomen: soft/nontender/nondistended/normal bowel sounds. No rebound or guarding.  Ext: no edema Skin: warm, dry Neuro: grossly normal, moves all extremities, PERRLA    Assessment and Plan  75 y.o. male presenting for annual physical.  Health Maintenance counseling: 1. Anticipatory guidance: Patient counseled regarding regular dental exams - advised q6 months, eye exams- advised yearly- got off track with pandemic,   avoiding smoking and second hand smoke , limiting alcohol to 2 beverages per day- doesn't drink .  No illicit drugs.  2. Risk factor reduction:  Advised patient of need for regular exercise and diet rich and fruits and vegetables to reduce risk of heart attack and stroke. Exercise-was walking dog last visit with some intermittent stops- once or twice a day for 10 mins.  Diet-tremendous weight loss at last visit with substantial lifestyle changes!Marland Kitchen at 200 on home scales- we think that is a great spot to  stay!  Wt Readings from Last 3 Encounters:  10/23/20 205 lb 12.8 oz (93.4 kg)  04/27/20 212 lb 9.6 oz (96.4 kg)  10/20/19 254 lb 12.8 oz (115.6 kg)  3. Immunizations/screenings/ancillary studies DISCUSSED:  -COVID vaccination  - just had omicron in July-needs to wait minimum of 3 months for booster if he opts to pursue in about 6 months  -Flu vaccination (last one 09/21) - high-dose flu shot today -prevnar 20 next year likely Immunization History  Administered Date(s) Administered   Fluad Quad(high Dose 65+) 10/12/2018, 10/20/2019   Influenza Split 12/25/2011   Influenza, High Dose Seasonal PF 02/28/2017   Moderna Sars-Covid-2 Vaccination 03/04/2019, 04/06/2019    Pneumococcal Conjugate-13 06/24/2014   Pneumococcal Polysaccharide-23 02/28/2017   Td 10/14/2008   Tdap 04/27/2020   Zoster Recombinat (Shingrix) 04/27/2019, 06/29/2019   Zoster, Live 12/25/2011  4. Prostate cancer screening- low risk PSA trend last year-we have opted to discontinue screening per [place] PTF guidelines.  Nocturia stable 0-3 times a night- once again this year-no consistent worsening or other new urinary symptoms Lab Results  Component Value Date   PSA 3.34 10/12/2018   PSA 3.19 06/29/2014   PSA 3.21 12/25/2011   5. Colon cancer screening - colonoscopy 03/25/18, history adenomatous polyp -3-year repeat planned 6. Skin cancer screening- no dermatologist-prior cryotherapy for AK in office as a smaller area of possible AK but also right where mask rubs- monitor for now.advised regular sunscreen use. Denies worrisome, changing, or new skin lesions.  7. Smoking associated screening (lung cancer screening, AAA screen 65-75, UA)- never smoker 8. STD screening - monogamous with wife  Status of chronic or acute concerns   #social update- still working. 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 BMi had gone form obesity range to overweight range. His goal was 205 on home scales   Lab Results  Component Value Date   CHOL 144 10/20/2019   HDL 36 (L) 10/20/2019   LDLCALC 83 10/20/2019   TRIG 145 10/20/2019   CHOLHDL 4.0 10/20/2019   A/P: Suspect improved with tremendous lifestyle changes-update lipid panel and calculate ascvd risk = If numbers were unchanged 10-year ASCVD risk would be 26%-we discussed if remains above 20% considering coronary artery calcium scoring-we will get further information with the lipid panel first  #hypertension S: medication:  lisinopril  40mg  daily -he stopped hctz with weight loss- had been on 12.5 mg Home bp :  avg 129.5 62.8  Over 10 readings once a week BP Readings from Last 3 Encounters:  10/23/20 (!) 131/58  04/27/20 126/60  10/20/19 124/62  A/P: Reasonable control-continue current medication lisinopril alone- we will remove hctz from med list- he will reach out if blodo pressure trends up  #hypothyroidism S: compliant On thyroid medication-levothyroxine 225 mcg daily Lab Results  Component Value Date   TSH 0.67 10/20/2019   A/P: hopefully stable- update TSH today. Continue current meds for now    Recommended follow up: Return in about 6 months (around 04/23/2021) for follow-up or sooner if needed.  Lab/Order associations: Not fasting   ICD-10-CM   1. Preventative health care  Z00.00 CBC with Differential/Platelet    Comprehensive metabolic panel    Lipid panel    TSH    2. Hyperlipidemia, unspecified hyperlipidemia  type  E78.5 CBC with Differential/Platelet    Comprehensive metabolic panel    Lipid panel    3. Hypothyroidism, unspecified type  E03.9 TSH     I,Jada Bradford,acting as a scribe for Garret Reddish, MD.,have documented all relevant documentation on the behalf of Garret Reddish, MD,as directed by  Garret Reddish, MD while in the presence of Garret Reddish, MD.   I, Garret Reddish, MD, have reviewed all documentation for this visit. The documentation on 10/23/20 for the exam, diagnosis, procedures, and orders are all accurate and complete.   Return precautions advised.  Garret Reddish, MD

## 2020-10-23 ENCOUNTER — Other Ambulatory Visit: Payer: Self-pay

## 2020-10-23 ENCOUNTER — Ambulatory Visit (INDEPENDENT_AMBULATORY_CARE_PROVIDER_SITE_OTHER): Payer: BC Managed Care – PPO | Admitting: Family Medicine

## 2020-10-23 ENCOUNTER — Encounter: Payer: Self-pay | Admitting: Family Medicine

## 2020-10-23 VITALS — BP 131/58 | HR 53 | Temp 98.2°F | Ht 73.0 in | Wt 205.8 lb

## 2020-10-23 DIAGNOSIS — E785 Hyperlipidemia, unspecified: Secondary | ICD-10-CM | POA: Diagnosis not present

## 2020-10-23 DIAGNOSIS — E039 Hypothyroidism, unspecified: Secondary | ICD-10-CM | POA: Diagnosis not present

## 2020-10-23 DIAGNOSIS — Z23 Encounter for immunization: Secondary | ICD-10-CM | POA: Diagnosis not present

## 2020-10-23 DIAGNOSIS — E0590 Thyrotoxicosis, unspecified without thyrotoxic crisis or storm: Secondary | ICD-10-CM

## 2020-10-23 DIAGNOSIS — Z0000 Encounter for general adult medical examination without abnormal findings: Secondary | ICD-10-CM

## 2020-10-23 DIAGNOSIS — I10 Essential (primary) hypertension: Secondary | ICD-10-CM

## 2020-10-23 LAB — CBC WITH DIFFERENTIAL/PLATELET
Basophils Absolute: 0.1 10*3/uL (ref 0.0–0.1)
Basophils Relative: 1.4 % (ref 0.0–3.0)
Eosinophils Absolute: 0.3 10*3/uL (ref 0.0–0.7)
Eosinophils Relative: 4.4 % (ref 0.0–5.0)
HCT: 42.8 % (ref 39.0–52.0)
Hemoglobin: 14.4 g/dL (ref 13.0–17.0)
Lymphocytes Relative: 27.7 % (ref 12.0–46.0)
Lymphs Abs: 1.9 10*3/uL (ref 0.7–4.0)
MCHC: 33.8 g/dL (ref 30.0–36.0)
MCV: 85.1 fl (ref 78.0–100.0)
Monocytes Absolute: 0.6 10*3/uL (ref 0.1–1.0)
Monocytes Relative: 8.3 % (ref 3.0–12.0)
Neutro Abs: 4.1 10*3/uL (ref 1.4–7.7)
Neutrophils Relative %: 58.2 % (ref 43.0–77.0)
Platelets: 155 10*3/uL (ref 150.0–400.0)
RBC: 5.03 Mil/uL (ref 4.22–5.81)
RDW: 13.4 % (ref 11.5–15.5)
WBC: 7 10*3/uL (ref 4.0–10.5)

## 2020-10-23 LAB — COMPREHENSIVE METABOLIC PANEL
ALT: 16 U/L (ref 0–53)
AST: 19 U/L (ref 0–37)
Albumin: 4.3 g/dL (ref 3.5–5.2)
Alkaline Phosphatase: 40 U/L (ref 39–117)
BUN: 12 mg/dL (ref 6–23)
CO2: 26 mEq/L (ref 19–32)
Calcium: 9.1 mg/dL (ref 8.4–10.5)
Chloride: 107 mEq/L (ref 96–112)
Creatinine, Ser: 0.84 mg/dL (ref 0.40–1.50)
GFR: 86.76 mL/min (ref >60.00)
Glucose, Bld: 86 mg/dL (ref 70–99)
Potassium: 4.2 mEq/L (ref 3.5–5.1)
Sodium: 142 mEq/L (ref 135–145)
Total Bilirubin: 0.9 mg/dL (ref 0.2–1.2)
Total Protein: 6.5 g/dL (ref 6.0–8.3)

## 2020-10-23 LAB — TSH: TSH: 0.05 u[IU]/mL — ABNORMAL LOW (ref 0.35–5.50)

## 2020-10-23 LAB — LIPID PANEL
Cholesterol: 127 mg/dL (ref 0–200)
HDL: 42 mg/dL (ref >39.00)
LDL Cholesterol: 74 mg/dL (ref 0–99)
NonHDL: 84.93
Total CHOL/HDL Ratio: 3
Triglycerides: 55 mg/dL (ref 0.0–149.0)
VLDL: 11 mg/dL (ref 0.0–40.0)

## 2020-10-23 NOTE — Patient Instructions (Addendum)
Health Maintenance Due  Topic Date Due   COVID-19 Vaccine (3 - Moderna risk series)   - Please consider new Omicron-Specific shot in about 6 months.  Marland Kitchen  05/04/2019   INFLUENZA VACCINE - High dose shot done today.   08/21/2020   Discussed getting Prevnar 20 shot next year.   Goal for blood pressure needs to be <135/85 on average of home readings. Check at least weekly  Please schedule dentist visit and eye doctor visit.  Please stop by lab before you go If you have mychart- we will send your results within 3 business days of [place] receiving them.  If you do not have mychart- we will call you about results within 5 business days of [place] receiving them.  *please also note that you will see labs on mychart as soon as they post. I will later go in and write notes on them- will say "notes from Dr. Yong Channel"  Recommended follow up: Return in about 6 months (around 04/23/2021) for follow-up or sooner if needed.

## 2020-10-23 NOTE — Assessment & Plan Note (Signed)
S: medication:  lisinopril 40mg  daily -he stopped hctz with weight loss- had been on 12.5 mg Home bp :  avg 129.5 62.8  Over 10 readings once a week BP Readings from Last 3 Encounters:  10/23/20 (!) 131/58  04/27/20 126/60  10/20/19 124/62  A/P: Reasonable control-continue current medication lisinopril alone- we will remove hctz from med list- he will reach out if blodo pressure trends up

## 2020-11-05 ENCOUNTER — Other Ambulatory Visit: Payer: Self-pay | Admitting: Family Medicine

## 2020-11-27 ENCOUNTER — Other Ambulatory Visit: Payer: Self-pay | Admitting: Family Medicine

## 2021-02-26 ENCOUNTER — Other Ambulatory Visit: Payer: Self-pay | Admitting: Family Medicine

## 2021-03-22 ENCOUNTER — Encounter: Payer: Self-pay | Admitting: Gastroenterology

## 2021-04-16 NOTE — Progress Notes (Signed)
? ?Phone [phone] ?In person visit ?  ?Subjective:  ? ?Darrell Griffin is a 76 y.o. year old very pleasant male patient who presents for/with See problem oriented charting ?Chief Complaint  ?Patient presents with  ? Follow-up  ? Hypothyroidism  ? Hyperlipidemia  ? Rash  ?  Pt c/o rash on his arms and lower legs that he noticed a while ago. Has not tried anything otc.  ? ? ?This visit occurred during the SARS-CoV-2 public health emergency.  Safety protocols were in place, including screening questions prior to the visit, additional usage of staff PPE, and extensive cleaning of exam room while observing appropriate contact time as indicated for disinfecting solutions.  ? ?Past Medical History-  ?Patient Active Problem List  ? Diagnosis Date Noted  ? Dyslipidemia 10/20/2019  ?  Priority: Medium   ? Hypothyroidism 10/22/2005  ?  Priority: Medium   ? Essential hypertension 10/22/2005  ?  Priority: Medium   ? History of adenomatous polyp of colon 04/01/2018  ?  Priority: Low  ? Obesity, Class I, BMI 30-34.9 12/25/2011  ?  Priority: Low  ? Pain of left lateral upper thigh 11/17/2018  ? ? ?Medications- reviewed and updated ?Current Outpatient Medications  ?Medication Sig Dispense Refill  ? levothyroxine (SYNTHROID) 200 MCG tablet TAKE 1 TABLET BY MOUTH EVERY DAY WITH 25MCG 90 tablet 3  ? lisinopril (ZESTRIL) 40 MG tablet TAKE 1 TABLET BY MOUTH EVERY DAY 90 tablet 3  ? predniSONE (DELTASONE) 20 MG tablet Take 2 pills for 3 days, 1 pill for 4 days 10 tablet 0  ? ?No current facility-administered medications for this visit.  ? ?  ?Objective:  ?BP (!) 110/56   Pulse (!) 54   Temp 98.3 ?F (36.8 ?C)   Ht '6\' 1"'$  (1.854 m)   Wt 217 lb 6.4 oz (98.6 kg)   SpO2 98%   BMI 28.68 kg/m?  ?Gen: NAD, resting comfortably ?CV: RRR no murmurs rubs or gallops ?Lungs: CTAB no crackles, wheeze, rhonchi ?Abdomen: soft/nontender/nondistended/normal bowel sounds. No rebound or guarding.  ?Ext: no edema ?Skin: warm, dry ?Hip: ?ROM  full ?Strength ?IR: 5/5, ER: 5/5, Flexion: 5/5, Extension: 5/5, Abduction: 5/5, Adduction: 5/5 ?Pelvic alignment unremarkable to inspection and palpation. ?Standing hip rotation and gait without trendelenburg / unsteadiness. ?Greater trochanter without tenderness to palpation. ?No tenderness over piriformis and greater trochanter. ?No SI joint tenderness and normal minimal SI movement. ? ?  ? ?Assessment and Plan  ? ?#Rash ?S:noted red spots for several months. Spares palms/soles/face/neck/abdomen. Present on lower arms, lower legs, buttocks, and back some. Spots come and go - last for at least a week. They itch. No pain.  ?- wife doesn't have similar ?- he goes to office 3 days a week but no one else has similar areas.  ?-he does not see derm ?- no new fabric softeners or laundry detergents or soaps or shampoos- had made a change in one of these but converted back without improvement ?ROS-not ill appearing, no fever/chills. No new medications. Not immunocompromised. No mucus membrane involvement.  ?A/P: Rash of unclear etiology.  No red flags thankfully.  Duration is concerning to me-referral to dermatology was placed.  We also discussed option of trying something like prednisone to see if we can calm this down for a 7-day course-patient chooses to try this ?- prednisone sent in ?  ?#hyperlipidemia ?S: Medication:none   ?-Had done an excellent job with weight loss-trending up slightly today-his goal has been 205 on home  scales - but have also adjusted thyroid medicine ?- exercising some - walking dog twice a day 10 mins. Would encourage more regular exercise. Diet has slipped up some ?Lab Results  ?Component Value Date  ? CHOL 127 10/23/2020  ? HDL 42.00 10/23/2020  ? [place] 74 10/23/2020  ? TRIG 55.0 10/23/2020  ? CHOLHDL 3 10/23/2020  ? A/P: cholesterol likely worsening- continue efforts for healthy eating/regular exercise nad recheck next visit ?  ?#hypertension ?S: medication:  lisinopril '40mg'$  daily -no  ligtheadedness or dizizness ?-he stopped hctz with weight loss- had been on 12.5 mg- now off doing well ?Home readings #s: not recently ?BP Readings from Last 3 Encounters:  ?04/26/21 (!) 110/56  ?10/23/20 (!) 131/58  ?04/27/20 126/60  ?A/P:  Controlled. Continue current medications.   ?  ?#hypothyroidism ?S: compliant On thyroid medication-levothyroxine 200 mcg daily ?Lab Results  ?Component Value Date  ? TSH 0.05 (L) 10/23/2020  ? A/P:Hopefully improved-update TSH with labs today ?  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 Showed double bandaid splint option ? ?#Right lateral hip pain with standing. No fall or injury or strain. 6-8 weeks of symptoms. Pain is better with walking- worse with standing ?- offered [facility] referral- he declines unless worsens- may be worth trying heating pad or ice- do suspect MSK related  ? ?Health Maintenance Due  ?Topic Date Due  ? COLONOSCOPY - gave information for him to call 03/24/2021  ? ?Recommended follow up: Return in about 6 months (around 10/26/2021) for physical or sooner if needed.Schedule b4 you leave. ?Future Appointments  ?Date Time Provider [place]  ?10/25/2021  9:40 AM Yong Channel, Brayton Mars, MD [facility]  ? ?Lab/Order associations: ?  ICD-10-CM   ?1. Hyperlipidemia, unspecified hyperlipidemia type  E78.5   ?  ?2. Essential hypertension  I10 CBC with Differential/Platelet  ?  Comprehensive metabolic panel  ?  CANCELED: CBC with Differential/Platelet  ?  CANCELED: Comprehensive metabolic panel  ?  ?3. Hypothyroidism, unspecified type  E03.9 TSH  ?  CANCELED: TSH  ?  ?4. Rash  R21 Ambulatory referral to Dermatology  ?  ?5. Dyslipidemia  E78.5   ?  ? ? ?Meds ordered this encounter  ?Medications  ? predniSONE (DELTASONE) 20 MG tablet  ?  Sig: Take 2 pills for 3 days, 1 pill for 4 days  ?  Dispense:  10 tablet  ?  Refill:  0  ? ? ?Return precautions advised.  ?Garret Reddish, MD ? ? ?

## 2021-04-26 ENCOUNTER — Encounter: Payer: Self-pay | Admitting: Family Medicine

## 2021-04-26 ENCOUNTER — Ambulatory Visit: Payer: BC Managed Care – PPO | Admitting: Family Medicine

## 2021-04-26 VITALS — BP 110/56 | HR 54 | Temp 98.3°F | Ht 73.0 in | Wt 217.4 lb

## 2021-04-26 DIAGNOSIS — I10 Essential (primary) hypertension: Secondary | ICD-10-CM | POA: Diagnosis not present

## 2021-04-26 DIAGNOSIS — E039 Hypothyroidism, unspecified: Secondary | ICD-10-CM

## 2021-04-26 DIAGNOSIS — E785 Hyperlipidemia, unspecified: Secondary | ICD-10-CM

## 2021-04-26 DIAGNOSIS — R21 Rash and other nonspecific skin eruption: Secondary | ICD-10-CM | POA: Diagnosis not present

## 2021-04-26 MED ORDER — PREDNISONE 20 MG PO TABS: ORAL_TABLET | ORAL | 0 refills | Status: DC

## 2021-04-26 NOTE — Patient Instructions (Addendum)
[facility] contact ?Please call to schedule colonoscopy ?Address: [address], [place], [place] [address] ?Phone: [phone]  ? ?We will call you within two weeks about your referral to dermatology. If you do not hear within 2 weeks, give [place] a call. If worsening rash sooner than that let us know- trial prednisone was sent in today ? ?Please stop by lab before you go ?If you have mychart- we will send your results within 3 business days of [place] receiving them.  ?If you do not have mychart- we will call you about results within 5 business days of [place] receiving them.  ?*please also note that you will see labs on mychart as soon as they post. I will later go in and write notes on them- will say "notes from Dr. Yong Channel"  ? ? ?Recommended follow up: Return in about 6 months (around 10/26/2021) for physical or sooner if needed.Schedule b4 you leave. ?

## 2021-04-27 LAB — COMPREHENSIVE METABOLIC PANEL
AG Ratio: 2.3 (calc) (ref 1.0–2.5)
ALT: 16 U/L (ref 9–46)
AST: 21 U/L (ref 10–35)
Albumin: 4.4 g/dL (ref 3.6–5.1)
Alkaline phosphatase (APISO): 38 U/L (ref 35–144)
BUN: 13 mg/dL (ref 7–25)
CO2: 29 mmol/L (ref 20–32)
Calcium: 8.9 mg/dL (ref 8.6–10.3)
Chloride: 108 mmol/L (ref 98–110)
Creat: 0.97 mg/dL (ref 0.70–1.28)
Globulin: 1.9 g/dL (calc) (ref 1.9–3.7)
Glucose, Bld: 99 mg/dL (ref 65–99)
Potassium: 4 mmol/L (ref 3.5–5.3)
Sodium: 144 mmol/L (ref 135–146)
Total Bilirubin: 0.9 mg/dL (ref 0.2–1.2)
Total Protein: 6.3 g/dL (ref 6.1–8.1)

## 2021-04-27 LAB — CBC WITH DIFFERENTIAL/PLATELET
Absolute Monocytes: 612 cells/uL (ref 200–950)
Basophils Absolute: 81 cells/uL (ref 0–200)
Basophils Relative: 0.9 %
Eosinophils Absolute: 189 cells/uL (ref 15–500)
Eosinophils Relative: 2.1 %
HCT: 43.9 % (ref 38.5–50.0)
Hemoglobin: 14.7 g/dL (ref 13.2–17.1)
Lymphs Abs: 3609 cells/uL (ref 850–3900)
MCH: 29.3 pg (ref 27.0–33.0)
MCHC: 33.5 g/dL (ref 32.0–36.0)
MCV: 87.6 fL (ref 80.0–100.0)
MPV: 10.9 fL (ref 7.5–12.5)
Monocytes Relative: 6.8 %
Neutro Abs: 4509 cells/uL (ref 1500–7800)
Neutrophils Relative %: 50.1 %
Platelets: 142 10*3/uL (ref 140–400)
RBC: 5.01 10*6/uL (ref 4.20–5.80)
RDW: 12.8 % (ref 11.0–15.0)
Total Lymphocyte: 40.1 %
WBC: 9 10*3/uL (ref 3.8–10.8)

## 2021-04-27 LAB — TSH: TSH: 0.93 mIU/L (ref 0.40–4.50)

## 2021-07-23 ENCOUNTER — Ambulatory Visit: Payer: BC Managed Care – PPO | Admitting: Family Medicine

## 2021-07-23 ENCOUNTER — Encounter: Payer: Self-pay | Admitting: Family Medicine

## 2021-07-23 VITALS — BP 130/64 | HR 61 | Temp 98.2°F | Ht 73.0 in | Wt 225.8 lb

## 2021-07-23 DIAGNOSIS — R21 Rash and other nonspecific skin eruption: Secondary | ICD-10-CM | POA: Diagnosis not present

## 2021-07-23 DIAGNOSIS — I10 Essential (primary) hypertension: Secondary | ICD-10-CM | POA: Diagnosis not present

## 2021-07-23 DIAGNOSIS — E039 Hypothyroidism, unspecified: Secondary | ICD-10-CM

## 2021-07-23 NOTE — Patient Instructions (Addendum)
[facility] contact Please call to schedule visit and/or procedure Address: [address], [place], [place] [address] Phone: [phone]   Since you are so close to dermatology visit opted to hold off on biopsy of rash today - also discussed possible refreeze on face but think it makes sense this close to chat with derm first to see their opinion   Recommended follow up: Return for next already scheduled visit or sooner if needed. Look forward to hearing about workup and hopeful resolution of rash by next visit

## 2021-07-23 NOTE — Progress Notes (Signed)
Phone [phone] In person visit   Subjective:   Darrell Griffin is a 76 y.o. year old very pleasant male patient who presents for/with See problem oriented charting Chief Complaint  Patient presents with   spot on face   Rash    Pt c/o rash/bumps on arms and legs that has been going on for a while.    Past Medical History-  Patient Active Problem List   Diagnosis Date Noted   Dyslipidemia 10/20/2019    Priority: Medium    Hypothyroidism 10/22/2005    Priority: Medium    Essential hypertension 10/22/2005    Priority: Medium    History of adenomatous polyp of colon 04/01/2018    Priority: Low   Obesity, Class I, BMI 30-34.9 12/25/2011    Priority: Low   Pain of left lateral upper thigh 11/17/2018    Medications- reviewed and updated Current Outpatient Medications  Medication Sig Dispense Refill   levothyroxine (SYNTHROID) 200 MCG tablet TAKE 1 TABLET BY MOUTH EVERY DAY WITH 25MCG 90 tablet 3   lisinopril (ZESTRIL) 40 MG tablet TAKE 1 TABLET BY MOUTH EVERY DAY 90 tablet 3   No current facility-administered medications for this visit.     Objective:  BP 130/64   Pulse 61   Temp 98.2 F (36.8 C)   Ht '6\' 1"'$  (1.854 m)   Wt 225 lb 12.8 oz (102.4 kg)   SpO2 98%   BMI 29.79 kg/m  Gen: NAD, resting comfortably CV: RRR no murmurs rubs or gallops Lungs: CTAB no crackles, wheeze, rhonchi Abdomen: soft/nontender/nondistended/normal bowel sounds. No rebound or guarding.  Ext: no edema Skin: warm, dry, erythematous papular rash again noted on arms and legs mainly- more limited on trunk    Assessment and Plan    #Rash S: Patient had noted rash for several months at April visit that spares palms/soles/face/neck/abdomen-present on arms/lower legs/buttocks and back some. Spots come and go - itchy when they come out but no pain.  -wife withotu similar - no new known contacts - no red flags last visit  Unclear etiology and we opted to refer to dermatology for their expert  opinion.  We tried a 7-day course of prednisone at last visit as well.  We referred to dermatology but unable to get in until December  He was able to get a visit in [facility] 29th of this month.   He states prednisone helped- dried lesions up some but never went away completely.  A/P: rash once again unknown etiology.  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 Continue current medications.   #hypothyroidism S: compliant On thyroid medication-levothyroxine 200 mcg Lab Results  Component Value Date   TSH 0.93 04/26/2021  A/P:Controlled. Continue current medications.    Recommended follow up: Return for next already scheduled visit or sooner if needed. Future Appointments  Date Time Provider Department  Center  10/25/2021 10:00 AM Marin Olp, MD [facility]  12/25/2021  9:30 AM Lavonna Monarch, MD [facility]   Lab/Order associations:   ICD-10-CM   1. Rash  R21     2. Essential hypertension  I10     3. Hypothyroidism, unspecified type  E03.9       No orders of the defined types were placed in this encounter.   Return precautions advised.  Garret Reddish, MD

## 2021-10-15 ENCOUNTER — Encounter: Payer: Self-pay | Admitting: *Deleted

## 2021-10-24 ENCOUNTER — Ambulatory Visit: Payer: BC Managed Care – PPO

## 2021-10-25 ENCOUNTER — Encounter: Payer: BC Managed Care – PPO | Admitting: Family Medicine

## 2021-11-27 ENCOUNTER — Other Ambulatory Visit: Payer: Self-pay | Admitting: Family Medicine

## 2021-12-25 ENCOUNTER — Ambulatory Visit: Payer: BC Managed Care – PPO | Admitting: Physician Assistant

## 2021-12-25 ENCOUNTER — Ambulatory Visit: Payer: BC Managed Care – PPO | Admitting: Dermatology

## 2022-01-03 ENCOUNTER — Encounter: Payer: Self-pay | Admitting: *Deleted

## 2022-02-26 ENCOUNTER — Other Ambulatory Visit: Payer: Self-pay | Admitting: Family Medicine

## 2022-03-14 ENCOUNTER — Encounter: Payer: BC Managed Care – PPO | Admitting: Family Medicine

## 2022-05-09 HISTORY — PX: APPENDECTOMY: SHX54

## 2022-11-11 ENCOUNTER — Encounter: Payer: Self-pay | Admitting: Family Medicine

## 2022-11-11 ENCOUNTER — Ambulatory Visit: Payer: BC Managed Care – PPO | Admitting: Family Medicine

## 2022-11-11 VITALS — BP 120/62 | HR 96 | Temp 97.8°F | Ht 73.0 in | Wt 250.8 lb

## 2022-11-11 DIAGNOSIS — M79651 Pain in right thigh: Secondary | ICD-10-CM

## 2022-11-11 DIAGNOSIS — E039 Hypothyroidism, unspecified: Secondary | ICD-10-CM | POA: Diagnosis not present

## 2022-11-11 DIAGNOSIS — Z23 Encounter for immunization: Secondary | ICD-10-CM | POA: Diagnosis not present

## 2022-11-11 DIAGNOSIS — I10 Essential (primary) hypertension: Secondary | ICD-10-CM

## 2022-11-11 DIAGNOSIS — M79652 Pain in left thigh: Secondary | ICD-10-CM | POA: Diagnosis not present

## 2022-11-11 DIAGNOSIS — Z1211 Encounter for screening for malignant neoplasm of colon: Secondary | ICD-10-CM

## 2022-11-11 MED ORDER — LEVOTHYROXINE SODIUM 200 MCG PO TABS: ORAL_TABLET | ORAL | 3 refills | Status: DC

## 2022-11-11 MED ORDER — LISINOPRIL 40 MG PO TABS: ORAL_TABLET | ORAL | 3 refills | Status: DC

## 2022-11-11 NOTE — Progress Notes (Signed)
b  Darrell Griffin is a 77 y.o. male who presents today for an office visit.  Assessment/Plan:  New/Acute Problems: Leg Pain / Weakness He has slight weakness with knee extension on exam today bilaterally but otherwise reassuring exam. It is possible his symptoms could be due to deconditioning however concern for lumbar radiculopathy based on his symptoms and history. We will check labs today to rule out other possible causes including CBC, CMET, TSH, B12, CK, sed rate, and CRP. Reassured patient and his wife that he does not have any other signs concerning for parkinson and that overall suspicion for this is low. Given his weakness on exam, he will need further testing and we will refer to neurology to help with this. IF he cannot get in to see them in a timely manner would consider MR lumbar and T spine soon. We discussed reasons to return to care and seek emergent care.   Chronic Problems Addressed Today: Hypothyroidism - on synthroid daily. Check TSH.  Essential hypertension  - At goal on lisinopril 40 mg daily  Preventative Healthcare Flu shot given today. Will refer for colonoscopy.     Subjective:  HPI:  See Assessment / plan for status of chronic conditions.  Patient here today with his wife. His main concern today is bilateral leg pain and weakness. Symptoms started about 3 months ago. He describes the pain as an intense discomfort in both of thighs. Initially, pain was so severe that it limited his ability to walk. This has been gradually improving the last several weeks. No obvious injuries or precipitating events. He did end up seeing his orthopedist last week for this and they recommended he be evaluated for parkinson disease. He denies any tremor. No rigidity. No cognitive concerns. He has fallen a couple of times within the last year that were mechanical in nature. His pain has improved significantly since starting a few months ago. He is not sure if walking makes the pain  better or worse. No back pain. No paresthesias. He did have similar symptoms a few years ago and was diagnosed with IT band syndrome. This was managed conservatively and symptoms gradually improved.        Objective:  Physical Exam: BP 120/62   Pulse 96   Temp 97.8 F (36.6 C) (Temporal)   Ht 6\' 1"  (1.854 m)   Wt 250 lb 12.8 oz (113.8 kg)   SpO2 96%   BMI 33.09 kg/m   Gen: No acute distress, resting comfortably CV: Regular rate and rhythm with no murmurs appreciated Pulm: Normal work of breathing, clear to auscultation bilaterally with no crackles, wheezes, or rhonchi MUSCULOSKELETAL: - legs: No deformities. Knee flexion 4/5 bilaterally. Otherwise strength 5/5 throughout. Sensation to light touch intact throughout.  Neuro: Grossly normal, moves all extremities Psych: Normal affect and thought content      Azariah Bonura M. Jimmey Ralph, MD 11/11/2022 3:21 PM

## 2022-11-11 NOTE — Patient Instructions (Addendum)
It was very nice to see you today!  I think you probably have a pinched nerve causing your symptoms.  We will check blood work and refer you to see a neurologist.   Return if symptoms worsen or fail to improve.   Take care, Dr Jimmey Ralph  PLEASE NOTE:  If you had any lab tests, please let us know if you have not heard back within a few days. You may see your results on mychart before we have a chance to review them but we will give you a call once they are reviewed by [place].   If we ordered any referrals today, please let us know if you have not heard from their office within the next week.   If you had any urgent prescriptions sent in today, please check with the pharmacy within an hour of our visit to make sure the prescription was transmitted appropriately.   Please try these tips to maintain a healthy lifestyle:  Eat at least 3 REAL meals and 1-2 snacks per day.  Aim for no more than 5 hours between eating.  If you eat breakfast, please do so within one hour of getting up.   Each meal should contain half fruits/vegetables, one quarter protein, and one quarter carbs (no bigger than a computer mouse)  Cut down on sweet beverages. This includes juice, soda, and sweet tea.   Drink at least 1 glass of water with each meal and aim for at least 8 glasses per day  Exercise at least 150 minutes every week.

## 2022-11-12 LAB — TSH: TSH: 0.96 u[IU]/mL (ref 0.35–5.50)

## 2022-11-12 LAB — COMPREHENSIVE METABOLIC PANEL
ALT: 41 U/L (ref 0–53)
AST: 38 U/L — ABNORMAL HIGH (ref 0–37)
Albumin: 4.3 g/dL (ref 3.5–5.2)
Alkaline Phosphatase: 43 U/L (ref 39–117)
BUN: 12 mg/dL (ref 6–23)
CO2: 28 meq/L (ref 19–32)
Calcium: 9.2 mg/dL (ref 8.4–10.5)
Chloride: 107 meq/L (ref 96–112)
Creatinine, Ser: 0.99 mg/dL (ref 0.40–1.50)
GFR: 74.7 mL/min (ref >60.00)
Glucose, Bld: 99 mg/dL (ref 70–99)
Potassium: 4.2 meq/L (ref 3.5–5.1)
Sodium: 144 meq/L (ref 135–145)
Total Bilirubin: 0.7 mg/dL (ref 0.2–1.2)
Total Protein: 6.6 g/dL (ref 6.0–8.3)

## 2022-11-12 LAB — VITAMIN B12: Vitamin B-12: 221 pg/mL (ref 211–911)

## 2022-11-12 LAB — CK: Total CK: 677 U/L — ABNORMAL HIGH (ref 7–232)

## 2022-11-12 LAB — CBC
HCT: 44.3 % (ref 39.0–52.0)
Hemoglobin: 14.6 g/dL (ref 13.0–17.0)
MCHC: 33 g/dL (ref 30.0–36.0)
MCV: 87.1 fL (ref 78.0–100.0)
Platelets: 170 10*3/uL (ref 150.0–400.0)
RBC: 5.09 Mil/uL (ref 4.22–5.81)
RDW: 13.4 % (ref 11.5–15.5)
WBC: 10.6 10*3/uL — ABNORMAL HIGH (ref 4.0–10.5)

## 2022-11-12 LAB — C-REACTIVE PROTEIN: CRP: <1 mg/dL (ref 0.5–20.0)

## 2022-11-12 LAB — SEDIMENTATION RATE: Sed Rate: 5 mm/h (ref 0–20)

## 2022-11-14 NOTE — Progress Notes (Signed)
He had an elevated CK level.  This indicates that he is having muscle damage.  It is not clear where this was coming from however it may be due to an autoimmune condition.  Recommend that he come back to recheck to make sure that his labs are stable.  Place place order for CK and c-Met.  Also recommend referral to rheumatology for further evaluation.  Please place urgent referral.  He should let us know if he is having any change in symptoms since our visit.

## 2022-11-18 ENCOUNTER — Other Ambulatory Visit: Payer: Self-pay | Admitting: *Deleted

## 2022-11-18 ENCOUNTER — Encounter: Payer: Self-pay | Admitting: Family Medicine

## 2022-11-18 DIAGNOSIS — R748 Abnormal levels of other serum enzymes: Secondary | ICD-10-CM

## 2022-11-18 NOTE — Telephone Encounter (Signed)
See results note. 

## 2022-11-19 NOTE — Progress Notes (Signed)
There is a blood test that can help diagnose celiac though recommend they discuss GI  symptoms PCP. Do not think this has anything to do with his current symptoms.  Darrell Griffin. Darrell Ralph, MD 11/19/2022 7:28 AM

## 2022-11-27 ENCOUNTER — Other Ambulatory Visit (INDEPENDENT_AMBULATORY_CARE_PROVIDER_SITE_OTHER): Payer: BC Managed Care – PPO

## 2022-11-27 DIAGNOSIS — R748 Abnormal levels of other serum enzymes: Secondary | ICD-10-CM

## 2022-11-27 LAB — COMPREHENSIVE METABOLIC PANEL
ALT: 34 U/L (ref 0–53)
AST: 29 U/L (ref 0–37)
Albumin: 4.1 g/dL (ref 3.5–5.2)
Alkaline Phosphatase: 42 U/L (ref 39–117)
BUN: 12 mg/dL (ref 6–23)
CO2: 28 meq/L (ref 19–32)
Calcium: 8.6 mg/dL (ref 8.4–10.5)
Chloride: 108 meq/L (ref 96–112)
Creatinine, Ser: 0.88 mg/dL (ref 0.40–1.50)
GFR: 84.3 mL/min (ref >60.00)
Glucose, Bld: 88 mg/dL (ref 70–99)
Potassium: 3.9 meq/L (ref 3.5–5.1)
Sodium: 142 meq/L (ref 135–145)
Total Bilirubin: 1.1 mg/dL (ref 0.2–1.2)
Total Protein: 6.3 g/dL (ref 6.0–8.3)

## 2022-11-27 LAB — CK: Total CK: 536 U/L — ABNORMAL HIGH (ref 7–232)

## 2022-11-28 ENCOUNTER — Encounter: Payer: Self-pay | Admitting: Neurology

## 2022-11-28 ENCOUNTER — Encounter: Payer: Self-pay | Admitting: Family Medicine

## 2022-11-28 NOTE — Progress Notes (Signed)
His CK is trending down but still level we want to be.  Can we make sure that he has a follow-up scheduled with either me or his PCP within the next few weeks?  He should let us know if his symptoms are worsening.

## 2022-11-29 NOTE — Telephone Encounter (Signed)
Recommend keeping appointments with both for now.  Can we check on how is symptoms are doing? His numbers are improving so hopefully his weakness is improving as well but he should let us know if that is not the case.  Darrell Griffin. Jimmey Ralph, MD 11/29/2022 11:44 AM

## 2022-12-03 NOTE — Telephone Encounter (Signed)
I am glad he is improving. I would like for him to come back to see [place] or his PCP within the next couple of weeks if possible so that we can evaluate his strength and recheck labs.  He needs to let us know if he does have any worsening symptoms.  Katina Degree. Jimmey Ralph, MD 12/03/2022 8:24 AM

## 2022-12-18 ENCOUNTER — Ambulatory Visit: Payer: BC Managed Care – PPO | Admitting: Family Medicine

## 2022-12-18 ENCOUNTER — Encounter: Payer: Self-pay | Admitting: Family Medicine

## 2022-12-18 VITALS — BP 124/60 | HR 72 | Temp 98.0°F | Ht 73.0 in | Wt 251.6 lb

## 2022-12-18 DIAGNOSIS — M609 Myositis, unspecified: Secondary | ICD-10-CM | POA: Diagnosis not present

## 2022-12-18 DIAGNOSIS — Z1211 Encounter for screening for malignant neoplasm of colon: Secondary | ICD-10-CM | POA: Diagnosis not present

## 2022-12-18 DIAGNOSIS — E039 Hypothyroidism, unspecified: Secondary | ICD-10-CM

## 2022-12-18 DIAGNOSIS — I10 Essential (primary) hypertension: Secondary | ICD-10-CM | POA: Diagnosis not present

## 2022-12-18 LAB — COMPREHENSIVE METABOLIC PANEL
ALT: 40 U/L (ref 0–53)
AST: 37 U/L (ref 0–37)
Albumin: 4.5 g/dL (ref 3.5–5.2)
Alkaline Phosphatase: 41 U/L (ref 39–117)
BUN: 15 mg/dL (ref 6–23)
CO2: 28 meq/L (ref 19–32)
Calcium: 8.4 mg/dL (ref 8.4–10.5)
Chloride: 107 meq/L (ref 96–112)
Creatinine, Ser: 0.92 mg/dL (ref 0.40–1.50)
GFR: 81.52 mL/min (ref >60.00)
Glucose, Bld: 96 mg/dL (ref 70–99)
Potassium: 4.5 meq/L (ref 3.5–5.1)
Sodium: 141 meq/L (ref 135–145)
Total Bilirubin: 1.1 mg/dL (ref 0.2–1.2)
Total Protein: 6.2 g/dL (ref 6.0–8.3)

## 2022-12-18 LAB — CBC
HCT: 44.1 % (ref 39.0–52.0)
Hemoglobin: 14.7 g/dL (ref 13.0–17.0)
MCHC: 33.4 g/dL (ref 30.0–36.0)
MCV: 87.2 fL (ref 78.0–100.0)
Platelets: 156 10*3/uL (ref 150.0–400.0)
RBC: 5.05 Mil/uL (ref 4.22–5.81)
RDW: 13.6 % (ref 11.5–15.5)
WBC: 10 10*3/uL (ref 4.0–10.5)

## 2022-12-18 LAB — CK: Total CK: 693 U/L — ABNORMAL HIGH (ref 7–232)

## 2022-12-18 NOTE — Progress Notes (Signed)
   Darrell Griffin is a 77 y.o. male who presents today for an office visit.  Assessment/Plan:  Myositis His symptoms have essentially resolved at this point.  His CK was trending down and we checked a couple of weeks ago.  We will recheck today.  He has had recurrent symptoms for the last several years.  He likely does have an underlying inflammatory or autoimmune condition and needs further workup.  He is not on a statin and has no rash consistent with dermatomyositis.  No recent illnesses, changes exercises, or other obvious precipitating events.  He will be seeing neurology next week.  We did refer him to rheumatology however he would not be able to see them for a few more months.  We will hold off on further specialized testing today until he can see the specialist.  He will let us know if he has any recurrence of symptoms before he sees the specialist.  Essential hypertension Well-controlled today on lisinopril 40 mg daily.  Check c-Met again.  Hypothyroidism Last TSH was at goal on Synthroid daily.     Subjective:  HPI:  See A/P for status of chronic conditions.  Patient is here today for follow-up.  My last saw him about a month ago.  At that time he was having significant bilateral leg pain and weakness for several months.  We checked labs which were notable for elevated CK of 677.  We did check other labs at that time including sed rate, CRP CBC, and c-Met which were stable.  He came back for repeat labs 2 weeks later and CK was improving to 536.  We had referred him to rheumatology and neurology however he has not yet been able to see them.  He will be seeing neurology in a week but will be a few more months before he can see rheumatology.  His symptoms have improved significantly over the last month.  Now he is back to baseline.  Has normal strength in his legs.  No further episodes of pain.  Patient and wife do note he has had similar issues intermittently for the last several  years.  Symptoms usually resolve within a few months.  Patient denies any recent illnesses.  No supplement intake.  No strenuous exercise.  No chest pain or shortness of breath.  No fevers or chills.  No numbness.  No tingling.  Patient does follow with dermatology and has occasional nonspecific small pinpoint erythematous lesions consistent with bug bites.  He has not had any large focal rashes.        Objective:  Physical Exam: BP 124/60   Pulse 72   Temp 98 F (36.7 C)   Ht 6\' 1"  (1.854 m)   Wt 251 lb 9.6 oz (114.1 kg)   SpO2 97%   BMI 33.19 kg/m   Gen: No acute distress, resting comfortably CV: Regular rate and rhythm with no murmurs appreciated Pulm: Normal work of breathing, clear to auscultation bilaterally with no crackles, wheezes, or rhonchi MUSCULOSKELETAL: - Legs: No deformities.  Strength 5 out of 5 with hip flexion, abduction, and adduction.  Knee strength 5 out of 5 with extension and flexion.  Neurovascular intact distally. Neuro: Grossly normal, moves all extremities Psych: Normal affect and thought content      Nyjah Schwake M. Jimmey Ralph, MD 12/18/2022 8:41 AM

## 2022-12-18 NOTE — Patient Instructions (Addendum)
It was very nice to see you today!  I am glad that you are feeling better.  We will check another set of labs today.  Please keep your appointment with the specialists.  Return if symptoms worsen or fail to improve.   Take care, Dr Jimmey Ralph  PLEASE NOTE:  If you had any lab tests, please let us know if you have not heard back within a few days. You may see your results on mychart before we have a chance to review them but we will give you a call once they are reviewed by [place].   If we ordered any referrals today, please let us know if you have not heard from their office within the next week.   If you had any urgent prescriptions sent in today, please check with the pharmacy within an hour of our visit to make sure the prescription was transmitted appropriately.   Please try these tips to maintain a healthy lifestyle:  Eat at least 3 REAL meals and 1-2 snacks per day.  Aim for no more than 5 hours between eating.  If you eat breakfast, please do so within one hour of getting up.   Each meal should contain half fruits/vegetables, one quarter protein, and one quarter carbs (no bigger than a computer mouse)  Cut down on sweet beverages. This includes juice, soda, and sweet tea.   Drink at least 1 glass of water with each meal and aim for at least 8 glasses per day  Exercise at least 150 minutes every week.

## 2022-12-23 NOTE — Progress Notes (Signed)
His CK is trending back up but the rest of his labs are all stable.  He needs to let us know if he is having any recurrent symptoms however if he is still feeling well then we do not need to do anything further at this point from a primary care standpoint.  He needs to keep his appointment with neurology for tomorrow for further management.  Can we check on rheumatology to see if there is any way they can get him in sooner? Ok to refer to another office or medical group if needed.  Katina Degree. Jimmey Ralph, MD 12/23/2022 12:45 PM

## 2022-12-24 ENCOUNTER — Ambulatory Visit: Payer: BC Managed Care – PPO | Admitting: Neurology

## 2022-12-24 ENCOUNTER — Other Ambulatory Visit: Payer: BC Managed Care – PPO

## 2022-12-24 ENCOUNTER — Encounter: Payer: Self-pay | Admitting: Neurology

## 2022-12-24 VITALS — BP 146/76 | HR 79 | Ht 73.0 in | Wt 252.0 lb

## 2022-12-24 DIAGNOSIS — M21372 Foot drop, left foot: Secondary | ICD-10-CM

## 2022-12-24 DIAGNOSIS — R748 Abnormal levels of other serum enzymes: Secondary | ICD-10-CM | POA: Diagnosis not present

## 2022-12-24 DIAGNOSIS — M21371 Foot drop, right foot: Secondary | ICD-10-CM

## 2022-12-24 NOTE — Progress Notes (Unsigned)
[facility] Neurology Division Clinic Note - Initial Visit   Date: 12/24/2022   Darrell Griffin MRN: [id number] DOB: [birth year]   Dear Dr. Jimmey Ralph:  Thank you for your kind referral of Darrell Griffin for consultation of hyperCKemia. Although his history is well known to you, please allow [place] to reiterate it for the purpose of our medical record. The patient was accompanied to the clinic by wife and daughter who also provides collateral information.     Darrell Griffin is a 77 y.o. 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   IMPRESSION/PLAN: HyperCKemia.  Exam shows intact proximal strength which is atypical for myopathy;  however he does have bilateral foot drop which I would expect more with neuropathy/radiculopathy.  To further evaluate his symptoms, I recommend NCS/EMG of the legs as well as serology testing as noted below.  He has not been on statin therapy.  He does not have mylagia and reports this tend to fluctuate.    - NCS/EMG of the legs  - Check myositis panel, SPEP with IFE  - Start PT for leg strengthening  Return to clinic in 3 months  ------------------------------------------------------------- History of present illness: Daughter reports that he fell while walking a dog in 2023 and fractured right shoulder. Over the past year, she also started noticing that he was lean over when walking and would not pick up his feet normally when walking.  He has difficulty with standing up from low chairs and sometimes would need to manually raise his legs.  He reports having discomfort in the thigh, described as achy.  He reports having a similar spells several years ago, which resolved without treatment.  Symptoms have gradually improved over the past three weeks and he no longer has thigh pain or leg weakness.  He continues to have weakness in the feet.  He has muscle cramps, no dark colored urine.  No  numbness/tingling.   Out-side paper records, electronic medical record, and images have been reviewed where available and summarized as:  Component     Latest Ref Rng 11/11/2022 11/27/2022 12/18/2022  CK Total     7 - 232 U/L 677 (H)  536 (H)  693 (H)   Legend: (H) High  Lab Results  Component Value Date   VITAMINB12 221 11/11/2022   Lab Results  Component Value Date   TSH 0.96 11/11/2022   Lab Results  Component Value Date   ESRSEDRATE 5 11/11/2022   Lab Results  Component Value Date   CRP <1.0 11/11/2022     Past Medical History:  Diagnosis Date   GERD (gastroesophageal reflux disease)    Hypertension    Hypothyroidism     Past Surgical History:  Procedure Laterality Date   THYROIDECTOMY     "cold spot", ultimately benign     Medications:  Outpatient Encounter Medications as of 12/24/2022  Medication Sig   levothyroxine (SYNTHROID) 200 MCG tablet TAKE 1 TABLET BY MOUTH EVERY DAY WITH (Patient taking differently: TAKE 1 TABLET BY MOUTH EVERY DAY WITH )   lisinopril (ZESTRIL) 40 MG tablet TAKE 1 TABLET BY MOUTH EVERY DAY   No facility-administered encounter medications on file as of 12/24/2022.    Allergies: No Known Allergies  Family History: Family History  Problem Relation Age of Onset   Seizures Father        past age 76   Breast cancer Mother  Hypertension Mother    Hypertension Brother    Colon cancer Neg Hx    Esophageal cancer Neg Hx    Rectal cancer Neg Hx    Stomach cancer Neg Hx     Social History: Social History   Tobacco Use   Smoking status: Never   Smokeless tobacco: Never  Substance Use Topics   Alcohol use: No    Alcohol/week: 0.0 standard drinks of alcohol   Drug use: No   Social History   Social History Narrative   Married. Daughter 58 in 2016. 5 granddaughters- 2 are stepdaughters.       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 Right Handed    Are you currently employed ? Yes    What is your current occupation? [employer] office    Do you live at home alone? No   Who lives with you? Lives with wife   What type of home do you live in: 1 story or 2 story? Lives in a one story home apartment        Vital Signs:  BP (!) 146/76   Pulse 79   Ht 6\' 1"  (1.854 m)   Wt 252 lb (114.3 kg)   SpO2 96%   BMI 33.25 kg/m    Neurological Exam: MENTAL STATUS including orientation to time, place, person, recent and remote memory, attention span and concentration, language, and fund of knowledge is normal.  Speech is not dysarthric.  CRANIAL NERVES: II:  No visual field defects.     III-IV-VI: Pupils equal round and reactive to light.  Normal conjugate, extra-ocular eye movements in all directions of gaze.  No nystagmus.  No ptosis.   V:  Normal facial sensation.    VII:  Normal facial symmetry and movements.   VIII:  Normal hearing and vestibular function.   IX-X:  Normal palatal movement.   XI:  Normal shoulder shrug and head rotation.   XII:  Normal tongue strength and range of motion, no deviation or fasciculation.  MOTOR:  Mild atrophy of the lower legs (TA and gastroc) bilaterally.  No fasciculations or abnormal movements.  No pronator drift.   Upper Extremity:  Right  Left  Deltoid  5/5   5/5   Biceps  5/5   5/5   Triceps  5/5   5/5   Infraspinatus 5/5  5/5  Medial pectoralis 5/5  5/5  Wrist extensors  5/5   5/5   Wrist flexors  5/5   5/5   Finger extensors  5/5   5/5   Finger flexors  5/5   5/5   Dorsal interossei  5/5   5/5   Abductor pollicis  5/5   5/5   Tone (Ashworth scale)  0  0   Lower Extremity:  Right  Left  Hip flexors  5/5   5/5   Hip extensors  5/5   5/5   Adductor 5/5  5/5  Abductor 5/5  5/5  Knee flexors  5/5   5/5   Knee extensors  5/5   5/5   Dorsiflexors  4/5   5/5   Plantarflexors  5/5   5/5   Toe extensors  4/5   4/5   Toe flexors   5/5   5/5   Tone (Ashworth scale)  0  0  MSRs:                                           Right        Left brachioradialis 1+  1+  biceps 1+  1+  triceps 1+  1+  patellar 1+  1+  ankle jerk 0  0  Hoffman no  no  plantar response down  down   SENSORY:  Reduced vibration at the left ankle and great toe.  Vibration intact on the right foot.  Pin prick and temperature is intact.  Mild sway with Romberg testing.   COORDINATION/GAIT: Normal finger-to- nose-finger.  Intact rapid alternating movements bilaterally.  Able to rise from a chair without using arms.  There is mild steppage gait bilaterally, unassisted, stable.  Unable to walking heels.  Toe walking intact.  Unable to perform tandem gait.    Thank you for allowing me to participate in patient's care.  If I can answer any additional questions, I would be pleased to do so.    Sincerely,    Deaisha Welborn K. Allena Katz, DO

## 2022-12-24 NOTE — Patient Instructions (Addendum)
Nerve testing of the legs  Check labs  Physical therapy for foot strengthening   Start vitamin B12 daily  ELECTROMYOGRAM AND NERVE CONDUCTION STUDIES (EMG/NCS) INSTRUCTIONS  How to Prepare The neurologist conducting the EMG will need to know if you have certain medical conditions. Tell the neurologist and other EMG lab personnel if you: Have a pacemaker or any other electrical medical device Take blood-thinning medications Have hemophilia, a blood-clotting disorder that causes prolonged bleeding Bathing Take a shower or bath shortly before your exam in order to remove oils from your skin. Don't apply lotions or creams before the exam.  What to Expect You'll likely be asked to change into a hospital gown for the procedure and lie down on an examination table. The following explanations can help you understand what will happen during the exam.  Electrodes. The neurologist or a technician places surface electrodes at various locations on your skin depending on where you're experiencing symptoms. Or the neurologist may insert needle electrodes at different sites depending on your symptoms.  Sensations. The electrodes will at times transmit a tiny electrical current that you may feel as a twinge or spasm. The needle electrode may cause discomfort or pain that usually ends shortly after the needle is removed. If you are concerned about discomfort or pain, you may want to talk to the neurologist about taking a short break during the exam.  Instructions. During the needle EMG, the neurologist will assess whether there is any spontaneous electrical activity when the muscle is at rest - activity that isn't present in healthy muscle tissue - and the degree of activity when you slightly contract the muscle.  He or she will give you instructions on resting and contracting a muscle at appropriate times. Depending on what muscles and nerves the neurologist is examining, he or she may ask you to  change positions during the exam.  After your EMG You may experience some temporary, minor bruising where the needle electrode was inserted into your muscle. This bruising should fade within several days. If it persists, contact your primary care doctor.

## 2022-12-30 LAB — PROTEIN ELECTROPHORESIS, SERUM
Abnormal Protein Band1: 0.2 g/dL — ABNORMAL HIGH
Albumin ELP: 4.2 g/dL (ref 3.8–4.8)
Alpha 1: 0.3 g/dL (ref 0.2–0.3)
Alpha 2: 0.5 g/dL (ref 0.5–0.9)
Beta 2: 0.4 g/dL (ref 0.2–0.5)
Beta Globulin: 0.4 g/dL (ref 0.4–0.6)
Gamma Globulin: 0.5 g/dL — ABNORMAL LOW (ref 0.8–1.7)
Total Protein: 6.3 g/dL (ref 6.1–8.1)

## 2022-12-30 LAB — IMMUNOFIXATION ELECTROPHORESIS
IgM, Serum: 22 mg/dL — ABNORMAL LOW (ref 50–300)
IgM, Serum: 693 mg/dL — ABNORMAL LOW (ref 600–300)
Immunoglobulin A: 138 mg/dL (ref 70–320)
Immunoglobulin A: 693 mg/dL (ref 70–320)

## 2022-12-30 LAB — MYOSITIS SPECIFIC II ANTIBODIES PANEL
EJ AB: <11 SI (ref <11)
JO-1 AB: <11 SI (ref <11)
MDA-5 AB: <11 SI (ref <11)
MI-2 ALPHA AB: <11 SI (ref <11)
MI-2 BETA AB: <11 SI (ref <11)
NXP-2 AB: <11 SI (ref <11)
OJ AB: <11 SI (ref <11)
PL-12 AB: <11 SI (ref <11)
PL-7 AB: <11 SI (ref <11)
SRP-AB: <11 SI (ref <11)
TIF-1y AB: <11 SI (ref <11)

## 2023-01-01 ENCOUNTER — Other Ambulatory Visit: Payer: Self-pay

## 2023-01-01 DIAGNOSIS — R779 Abnormality of plasma protein, unspecified: Secondary | ICD-10-CM

## 2023-01-01 DIAGNOSIS — M21372 Foot drop, left foot: Secondary | ICD-10-CM

## 2023-01-01 DIAGNOSIS — R748 Abnormal levels of other serum enzymes: Secondary | ICD-10-CM

## 2023-01-09 ENCOUNTER — Inpatient Hospital Stay: Payer: BC Managed Care – PPO

## 2023-01-09 ENCOUNTER — Other Ambulatory Visit: Payer: Self-pay

## 2023-01-09 ENCOUNTER — Inpatient Hospital Stay: Payer: BC Managed Care – PPO | Attending: Internal Medicine | Admitting: Internal Medicine

## 2023-01-09 ENCOUNTER — Encounter: Payer: Self-pay | Admitting: Internal Medicine

## 2023-01-09 VITALS — BP 140/57 | HR 61 | Temp 98.6°F | Resp 16 | Wt 252.0 lb

## 2023-01-09 DIAGNOSIS — Z803 Family history of malignant neoplasm of breast: Secondary | ICD-10-CM | POA: Diagnosis not present

## 2023-01-09 DIAGNOSIS — D472 Monoclonal gammopathy: Secondary | ICD-10-CM | POA: Insufficient documentation

## 2023-01-09 DIAGNOSIS — I10 Essential (primary) hypertension: Secondary | ICD-10-CM | POA: Diagnosis not present

## 2023-01-09 DIAGNOSIS — Z79899 Other long term (current) drug therapy: Secondary | ICD-10-CM | POA: Insufficient documentation

## 2023-01-09 LAB — CBC WITH DIFFERENTIAL/PLATELET
Abs Immature Granulocytes: 0.05 10*3/uL (ref 0.00–0.07)
Basophils Absolute: 0.1 10*3/uL (ref 0.0–0.1)
Basophils Relative: 1 %
Eosinophils Absolute: 0.2 10*3/uL (ref 0.0–0.5)
Eosinophils Relative: 2 %
HCT: 44.8 % (ref 39.0–52.0)
Hemoglobin: 14.9 g/dL (ref 13.0–17.0)
Immature Granulocytes: 1 %
Lymphocytes Relative: 38 %
Lymphs Abs: 3.9 10*3/uL (ref 0.7–4.0)
MCH: 29 pg (ref 26.0–34.0)
MCHC: 33.3 g/dL (ref 30.0–36.0)
MCV: 87.2 fL (ref 80.0–100.0)
Monocytes Absolute: 0.9 10*3/uL (ref 0.1–1.0)
Monocytes Relative: 9 %
Neutro Abs: 5 10*3/uL (ref 1.7–7.7)
Neutrophils Relative %: 49 %
Platelets: 156 10*3/uL (ref 150–400)
RBC: 5.14 MIL/uL (ref 4.22–5.81)
RDW: 13 % (ref 11.5–15.5)
WBC: 10.1 10*3/uL (ref 4.0–10.5)
nRBC: 0 % (ref 0.0–0.2)

## 2023-01-09 LAB — COMPREHENSIVE METABOLIC PANEL
ALT: 47 U/L — ABNORMAL HIGH (ref 0–44)
AST: 42 U/L — ABNORMAL HIGH (ref 15–41)
Albumin: 4.4 g/dL (ref 3.5–5.0)
Alkaline Phosphatase: 40 U/L (ref 38–126)
Anion gap: 11 (ref 5–15)
BUN: 14 mg/dL (ref 8–23)
CO2: 24 mmol/L (ref 22–32)
Calcium: 8.5 mg/dL — ABNORMAL LOW (ref 8.9–10.3)
Chloride: 105 mmol/L (ref 98–111)
Creatinine, Ser: 0.92 mg/dL (ref 0.61–1.24)
GFR, Estimated: >60 mL/min (ref >60)
Glucose, Bld: 98 mg/dL (ref 70–99)
Potassium: 4 mmol/L (ref 3.5–5.1)
Sodium: 140 mmol/L (ref 135–145)
Total Bilirubin: 1.2 mg/dL — ABNORMAL HIGH (ref <1.2)
Total Protein: 6.8 g/dL (ref 6.5–8.1)

## 2023-01-09 NOTE — Progress Notes (Signed)
He is doing well, he still working. He has noticed he is more fatigued recently. Patient has seen a neurologist.

## 2023-01-09 NOTE — Progress Notes (Signed)
Darrell Griffin  [phone] Fax:(336) [phone]  ID: Darrell Griffin OB: 1945/05/24  MR#: [id number]  YQI#:[id number]  Patient Care Team: Shelva Majestic, MD as PCP - General (Family Medicine) Glendale Chard, DO as Consulting Physician (Neurology) Fuller Plan, MD as Attending Physician (Rheumatology)  REFERRING PROVIDER: Dr. Nita Sickle  REASON FOR REFERRAL: MGUS  HPI: Darrell Griffin is a 77 y.o. male with past medical history of GERD, hypertension, hypothyroidism, hyperlipidemia was referred to hematology for management of abnormal SPEP.  Patient reports discomfort in thigh muscles and right more than left.  Has been ongoing workup with neurology.  His CK level was elevated and 600.  He is pending EMG.  Scheduled to see rheumatology in March.  As a part of workup of his symptoms, had SPEP done by Dr. Allena Katz.  SPEP/IFE showed 2 bands 0.2 g/dL, faint IgG lambda and IgG kappa monoclonal protein.  CBC and CMP is unremarkable.  Denies any neuropathy.  Denies bone pain.   REVIEW OF SYSTEMS:   ROS  As per HPI. Otherwise, a complete review of systems is negative.  PAST MEDICAL HISTORY: Past Medical History:  Diagnosis Date   GERD (gastroesophageal reflux disease)    Hypertension    Hypothyroidism     PAST SURGICAL HISTORY: Past Surgical History:  Procedure Laterality Date   APPENDECTOMY  05/09/2022   Emergency appendectomy   THYROIDECTOMY     "cold spot", ultimately benign    FAMILY HISTORY: Family History  Problem Relation Age of Onset   Breast cancer Mother            Hypertension Mother    Multiple myeloma Mother    Congestive Heart Failure Mother    Seizures Father        74 passed away   Diabetes type II Sister    Hypertension Brother    Diabetes type II Brother    Colon cancer Neg Hx    Esophageal cancer Neg Hx    Rectal cancer Neg Hx    Stomach cancer Neg Hx     HEALTH MAINTENANCE: Social History   Tobacco Use    Smoking status: Never   Smokeless tobacco: Never  Substance Use Topics   Alcohol use: No    Alcohol/week: 0.0 standard drinks of alcohol   Drug use: No     No Known Allergies  Current Outpatient Medications  Medication Sig Dispense Refill   cyanocobalamin (VITAMIN B12) 1000 MCG tablet Take 1,000 mcg by mouth daily.     levothyroxine (SYNTHROID) 200 MCG tablet TAKE 1 TABLET BY MOUTH EVERY DAY WITH (Patient taking differently: TAKE 1 TABLET BY MOUTH EVERY DAY WITH ) 90 tablet 3   lisinopril (ZESTRIL) 40 MG tablet TAKE 1 TABLET BY MOUTH EVERY DAY 90 tablet 3   No current facility-administered medications for this visit.    OBJECTIVE: Vitals:   01/09/23 1124  BP: (!) 140/57  Pulse: 61  Resp: 16  Temp: 98.6 F (37 C)  SpO2: 99%     Body mass index is 33.25 kg/m.      General: Well-developed, well-nourished, no acute distress. Eyes: Pink conjunctiva, anicteric sclera. HEENT: Normocephalic, moist mucous membranes, clear oropharnyx. Lungs: Clear to auscultation bilaterally. Heart: Regular rate and rhythm. No rubs, murmurs, or gallops. Abdomen: Soft, nontender, nondistended. No organomegaly noted, normoactive bowel sounds. Musculoskeletal: No edema, cyanosis, or clubbing. Neuro: Alert, answering all questions appropriately. Cranial nerves grossly intact. Skin: No rashes or petechiae noted. Psych:  Normal affect. Lymphatics: No cervical, calvicular, axillary or inguinal LAD.   LAB RESULTS:  Lab Results  Component Value Date   NA 141 12/18/2022   K 4.5 12/18/2022   CL 107 12/18/2022   CO2 28 12/18/2022   GLUCOSE 96 12/18/2022   BUN 15 12/18/2022   CREATININE 0.92 12/18/2022   CALCIUM 8.4 12/18/2022   PROT 6.3 12/24/2022   ALBUMIN 4.5 12/18/2022   AST 37 12/18/2022   ALT 40 12/18/2022   ALKPHOS 41 12/18/2022   BILITOT 1.1 12/18/2022   GFRNONAA 76 10/20/2019   GFRAA 88 10/20/2019    Lab Results  Component Value Date   WBC 10.0 12/18/2022   NEUTROABS  4,509 04/26/2021   HGB 14.7 12/18/2022   HCT 44.1 12/18/2022   MCV 87.2 12/18/2022   PLT 156.0 12/18/2022    No results found for: "TIBC", "FERRITIN", "IRONPCTSAT"   STUDIES: No results found.  ASSESSMENT AND PLAN:   Darrell Griffin is a 77 y.o. male with past medical history of GERD, hypertension, hypothyroidism, hyperlipidemia was referred to hematology for management of abnormal SPEP.  # MGUS - Patient reports discomfort in thigh muscles and right more than left.  Has been ongoing workup with neurology.  His CK level was elevated and 600.  He is pending EMG.  Scheduled to see rheumatology in March.  As a part of workup of his symptoms, had SPEP done by Dr. Allena Katz.  - SPEP/IFE showed 2 bands 0.2 g/dL, faint IgG lambda and IgG kappa monoclonal protein.  CBC and CMP is unremarkable.  Denies any neuropathy.  Denies bone pain.  No strong indication for bone imaging. -Discussed with the patient and his daughter about what this small amount of paraprotein means and the need for surveillance.  Per [facility] MGUS risk calculator, assuming his kappa lambda light chains are within normal limit, he will be considered low risk with risk of transformation into multiple myeloma at 20 years is low at 5%.  Discussed about surveillance with blood work at 12-month and if overall stable can consider monitoring once a year.  Explained that his symptoms of muscle discomfort and CK level are less likely to be related to the small paraprotein.  Continue workup with neurology and rheumatology.  # Hypertension-on lisinopril 40 mg daily # Low vitamin B12 level-on B12 supplements 1000 mcg daily  Orders Placed This Encounter  Procedures   Multiple Myeloma Panel (SPEP&IFE w/QIG)   Kappa/lambda light chains   CBC with Differential/Platelet   Comprehensive metabolic panel   CMP (Cancer Griffin only)   CBC with Differential (Cancer Griffin Only)   RTC in 6 months for MD visit, labs 2 weeks prior.  Patient expressed  understanding and was in agreement with this plan. He also understands that He can call clinic at any time with any questions, concerns, or complaints.   I spent a total of 45 minutes reviewing chart data, face-to-face evaluation with the patient, counseling and coordination of care as detailed above.  Michaelyn Barter, MD   01/09/2023 12:11 PM

## 2023-01-10 LAB — KAPPA/LAMBDA LIGHT CHAINS
Kappa free light chain: 24.3 mg/L — ABNORMAL HIGH (ref 3.3–19.4)
Kappa, lambda light chain ratio: 1.77 — ABNORMAL HIGH (ref 0.26–1.65)
Lambda free light chains: 13.7 mg/L (ref 5.7–26.3)

## 2023-01-24 LAB — MULTIPLE MYELOMA PANEL, SERUM
Albumin SerPl Elph-Mcnc: 4.1 g/dL (ref 2.9–4.4)
Albumin/Glob SerPl: 1.8 — ABNORMAL HIGH (ref 0.7–1.7)
Alpha 1: 0.2 g/dL (ref 0.0–0.4)
Alpha2 Glob SerPl Elph-Mcnc: 0.5 g/dL (ref 0.4–1.0)
B-Globulin SerPl Elph-Mcnc: 1 g/dL (ref 0.7–1.3)
Gamma Glob SerPl Elph-Mcnc: 0.5 g/dL (ref 0.4–1.8)
Globulin, Total: 2.3 g/dL (ref 2.2–3.9)
IgA: 138 mg/dL (ref 61–437)
IgG (Immunoglobin G), Serum: 708 mg/dL (ref 603–1613)
IgM (Immunoglobulin M), Srm: 23 mg/dL (ref 15–143)
M Protein SerPl Elph-Mcnc: 0.3 g/dL — ABNORMAL HIGH
Total Protein ELP: 6.4 g/dL (ref 6.0–8.5)

## 2023-01-30 ENCOUNTER — Ambulatory Visit: Payer: 59 | Admitting: Neurology

## 2023-01-30 DIAGNOSIS — M21371 Foot drop, right foot: Secondary | ICD-10-CM | POA: Diagnosis not present

## 2023-01-30 DIAGNOSIS — R748 Abnormal levels of other serum enzymes: Secondary | ICD-10-CM

## 2023-01-30 DIAGNOSIS — M21372 Foot drop, left foot: Secondary | ICD-10-CM | POA: Diagnosis not present

## 2023-01-30 NOTE — Procedures (Signed)
 [facility] Neurology  [address], [address]  [place], [place] [address] Tel: [phone] Fax: [phone] Test Date:  01/30/2023  Patient: Darrell Griffin DOB: October 24, 1945 Physician: Tonita Blanch, DO  Sex: Male Height: 6' 1 Ref Phys: Tonita Blanch, DO  ID#: [id number]   Technician:    History: This is a 78 year old man referred for evaluation of bilateral foot drop and hyperCKemia.  NCV & EMG Findings: Extensive electrodiagnostic testing of the right lower extremity and additional studies of the left shows:  Bilateral sural and superficial peroneal sensory responses are within normal limits. Bilateral peroneal (EDB) and tibial motor responses are absent.  Bilateral peroneal motor responses at the tibialis anterior shows reduced amplitudes. Bilateral tibial H reflex studies are within normal limits. Chronic motor axon loss changes are seen affecting nearly all the tested muscles in the lower extremities, which is worse distally where there is also evidence of fibrillation potentials.  Rare fasciculations are seen affecting the tibialis anterior bilaterally. Active denervation is present in the lumbar paraspinal muscles.  Impression: The electrophysiologic findings are most consistent with an active on chronic intraspinal canal lesion (i.e. radiculopathy, anterior horn cells, etc) affecting the L2-3-S1 nerve root/segments.    In particular, there is no evidence of a sensorimotor polyneuropathy or diffuse myopathy.   ___________________________ Tonita Blanch, DO    Nerve Conduction Studies   Stim Site NR Peak (ms) Norm Peak (ms) O-P Amp (V) Norm O-P Amp  Left Sup Peroneal Anti Sensory (Ant Lat Mall)  32 C  12 cm    3.8 <4.6 4.0 >3  Right Sup Peroneal Anti Sensory (Ant Lat Mall)  32 C  12 cm    2.8 <4.6 4.3 >3  Left Sural Anti Sensory (Lat Mall)  32 C  Calf    4.1 <4.6 4.0 >3  Right Sural Anti Sensory (Lat Mall)  32 C  Calf    2.9 <4.6 4.0 >3     Stim Site NR Onset  (ms) Norm Onset (ms) O-P Amp (mV) Norm O-P Amp Site1 Site2 Delta-0 (ms) Dist (cm) Vel (m/s) Norm Vel (m/s)  Left Peroneal Motor (Ext Dig Brev)  32 C  Ankle *NR  <6.0  >2.5 B Fib Ankle  0.0  >40  B Fib *NR     Poplt B Fib  0.0  >40  Poplt *NR            Right Peroneal Motor (Ext Dig Brev)  32 C  Ankle *NR  <6.0  >2.5 B Fib Ankle  0.0  >40  B Fib *NR     Poplt B Fib  0.0  >40  Poplt *NR            Left Peroneal TA Motor (Tib Ant)  32 C  Fib Head    3.6 <4.5 *1.6 >3 Poplit Fib Head 1.5 10.0 67 >40  Poplit    5.1 <5.7 1.5         Right Peroneal TA Motor (Tib Ant)  32 C  Fib Head    4.2 <4.5 *1.5 >3 Poplit Fib Head 1.5 10.0 67 >40  Poplit    5.7 <5.7 1.5         Left Tibial Motor (Abd Hall Brev)  32 C  Ankle *NR  <6.0  >4 Knee Ankle  0.0  >40  Knee *NR            Right Tibial Motor (Abd Hall Brev)  32 C  Ankle *NR  <6.0  >  4 Knee Ankle  0.0  >40  Knee *NR             Electromyography   Side Muscle Ins.Act Fibs Fasc Recrt Amp Dur Poly Activation Comment  Right AntTibialis Nml *1+ *1+ *3- *1+ *1+ *1+ Nml N/A  Right Gastroc Nml *1+ Nml *2- *1+ *1+ *1+ Nml N/A  Right Flex Dig Long Nml *1+ Nml *3- *1+ *1+ *1+ Nml N/A  Right RectFemoris Nml Nml Nml *2- *1+ *1+ *1+ Nml N/A  Right BicepsFemS Nml Nml Nml *1- *1+ *1+ *1+ Nml N/A  Right GluteusMed Nml Nml Nml Nml Nml Nml Nml Nml N/A  Right Lumbo Parasp Low Nml *1+ Nml Nml Nml Nml Nml Nml N/A  Left AntTibialis Nml *1+ *1+ *3- *1+ *1+ *1+ Nml N/A  Left Gastroc Nml *1+ Nml *2- *1+ *1+ *1+ Nml N/A  Left Flex Dig Long Nml Nml Nml *3- *1+ *1+ *1+ Nml N/A  Left RectFemoris Nml Nml Nml *2- *1+ *1+ *1+ Nml N/A  Left GluteusMed Nml Nml Nml *1- *1+ *1+ *1+ Nml N/A  Left BicepsFemS Nml Nml Nml *1- *1+ *1+ *1+ Nml N/A      Waveforms:

## 2023-02-07 ENCOUNTER — Ambulatory Visit: Payer: 59 | Admitting: Neurology

## 2023-02-07 ENCOUNTER — Encounter: Payer: Self-pay | Admitting: Neurology

## 2023-02-07 ENCOUNTER — Other Ambulatory Visit: Payer: 59

## 2023-02-07 VITALS — BP 146/58 | HR 84 | Ht 73.0 in | Wt 250.0 lb

## 2023-02-07 DIAGNOSIS — R29898 Other symptoms and signs involving the musculoskeletal system: Secondary | ICD-10-CM | POA: Diagnosis not present

## 2023-02-07 DIAGNOSIS — R531 Weakness: Secondary | ICD-10-CM

## 2023-02-07 DIAGNOSIS — M21371 Foot drop, right foot: Secondary | ICD-10-CM | POA: Diagnosis not present

## 2023-02-07 DIAGNOSIS — R292 Abnormal reflex: Secondary | ICD-10-CM | POA: Diagnosis not present

## 2023-02-07 DIAGNOSIS — M21372 Foot drop, left foot: Secondary | ICD-10-CM

## 2023-02-07 NOTE — Progress Notes (Signed)
Follow-up Visit   Date: 02/07/2023    Darrell Griffin MRN: [id number] DOB: August 06, 1945    Darrell Griffin is a 78 y.o. right-handed Caucasian male with hypertension, hypothyroidism, MGUS, and GERD returning to the clinic for follow-up of hyperCKemia and bilateral food drop.  The patient was accompanied to the clinic by daughter who also provides collateral information.    IMPRESSION/PLAN: Progressive painless weakness of the legs with bilateral foot drop.  NCS/EMG shows diffusely low motor responses with active on chronic neurogenic changes affecting nearly all the tested muscles.  I explained that given the irritability to the motor nerves, additional testing to better understand the nature of these findings is indicated.  Patient was informed that motor nerve pathology can stem from injury to the nerve from extrinsic causes (compression, inflammation, etc) and other times, it may be from internal cell pathology, what we refer to motor neuron disease.  We will proceed with testing as below to help determine this:  - MRI lumbar spine to look for structural pathology  - NCS/EMG of the arms   - Check vitamin ESR, CRP, B12, folate, copper, PTH, GM1  Low vitamin B12.  Continue oral supplementation, consider injections if levels remain low.   Return to clinic after testing  --------------------------------------------- History of present illness: Daughter reports that he fell while walking a dog in 2023 and fractured right shoulder. Over the past year, she also started noticing that he was lean over when walking and would not pick up his feet normally when walking.  He has difficulty with standing up from low chairs and sometimes would need to manually raise his legs.  He reports having discomfort in the thigh, described as achy.  He reports having a similar spells several years ago, which resolved without treatment.  Symptoms have gradually improved over the past three weeks and he no longer  has thigh pain or leg weakness.  He continues to have weakness in the feet.  He has muscle cramps, no dark colored urine.  No numbness/tingling.   UPDATE 02/07/2023:  He is here for follow-up visit with his daughter.  At his last visit, I ordered myositis panel which was normal and SPEP with IFE, the latter which showed IgG lambda and IgG kappa monoclonal protein for which he was referred to hematology.  His evaluation is consistent with MGUS.  He also had NCS/EMG of the legs last week which shows diffuse active on chronic motor axon loss changes affecting nearly all the muscles. Sensory responses were normal.    He no longer has pain in the thighs.  He is also able to raise his legs while standing to put pants on, where as previously, he was sitting to do this.   No interval falls.  He has not started physical therapy.    He has muscle cramps.  No muscle twitches, arm weakness, speech/swallow difficulty.   Medications:  Current Outpatient Medications on File Prior to Visit  Medication Sig Dispense Refill   cyanocobalamin (VITAMIN B12) 1000 MCG tablet Take 1,000 mcg by mouth daily.     levothyroxine (SYNTHROID) 200 MCG tablet TAKE 1 TABLET BY MOUTH EVERY DAY WITH (Patient taking differently: TAKE 1 TABLET BY MOUTH EVERY DAY WITH ) 90 tablet 3   lisinopril (ZESTRIL) 40 MG tablet TAKE 1 TABLET BY MOUTH EVERY DAY 90 tablet 3   No current facility-administered medications on file prior to visit.    Allergies: No Known Allergies  Vital Signs:  BP (!) 146/58   Pulse 84   Ht 6\' 1"  (1.854 m)   Wt 250 lb (113.4 kg)   SpO2 98%   BMI 32.98 kg/m     Neurological Exam: MENTAL STATUS including orientation to time, place, person, recent and remote memory, attention span and concentration, language, and fund of knowledge is normal.  Speech is not dysarthric.  CRANIAL NERVES:  Pupils equal round and reactive to light.  Normal conjugate, extra-ocular eye movements in all directions of  gaze.  No ptosis.  Face is symmetric. Palate elevates symmetrically.  Tongue is midline, no fasciculations.  MOTOR:  Mild atrophy of the lower legs (TA and gastroc) bilaterally.  No fasciculations or abnormal movements.  No pronator drift.    Upper Extremity:  Right   Left  Deltoid  5/5    5/5   Biceps  5/5    5/5   Triceps  5/5    5/5   Infraspinatus 5/5   5/5  Medial pectoralis 5/5   5/5  Wrist extensors  5/5    5/5   Wrist flexors  5/5    5/5   Finger extensors  5/5    5/5   Finger flexors  5/5    5/5   Dorsal interossei  5/5    5/5   Abductor pollicis  5/5    5/5   Tone (Ashworth scale)  0   0    Lower Extremity:  Right   Left  Hip flexors  4/5    4/5   Hip extensors  5/5    5/5   Adductor 5/5   5/5  Abductor 5/5   5/5  Knee flexors  5/5    5/5   Knee extensors  5/5    5/5   Dorsiflexors  4-/5    4-/5   Plantarflexors  4/5    4/5   Toe extensors  4/5    4/5   Toe flexors  4/5    4/5   Tone (Ashworth scale)  0   0    MSRs:                                              Right        Left brachioradialis 1+   1+  biceps 1+   1+  triceps 1+   1+  patellar 2+   2+  ankle jerk 0   0  Hoffman no   no  plantar response down   down   SENSORY:  Intact to vibration throughout.  COORDINATION/GAIT:  Mild steppage gait bilaterally.   Data: NCS/EMG of the legs 01/30/2023: The electrophysiologic findings are most consistent with an active on chronic intraspinal canal lesion (i.e. radiculopathy, anterior horn cells, etc) affecting the L2-S1 nerve root/segments.     In particular, there is no evidence of a sensorimotor polyneuropathy or diffuse myopathy.  Lab Results  Component Value Date   CKTOTAL 693 (H) 12/18/2022   CKMB 3.6 08/30/2009   TROPONINI < 0.02 01/27/2012   Lab Results  Component Value Date   ESRSEDRATE 5 11/11/2022   Lab Results  Component Value Date   CRP <1.0 11/11/2022   Lab Results  Component Value Date   VITAMINB12 221 11/11/2022    Total time  spent reviewing records, interview, history/exam, documentation, and coordination of care on  day of encounter:  40 min    Thank you for allowing me to participate in patient's care.  If I can answer any additional questions, I would be pleased to do so.    Sincerely,    Tais Koestner K. Allena Katz, DO

## 2023-02-14 LAB — C-REACTIVE PROTEIN: CRP: <3 mg/L (ref <8.0)

## 2023-02-14 LAB — GANGLIOSIDE GM-1 ABS (IGG & IGM)
GM-1 Ab (IgG): <1:800 {titer}
GM-1 Ab (IgM): <1:800 {titer}

## 2023-02-14 LAB — SEDIMENTATION RATE: Sed Rate: 2 mm/h (ref 0–20)

## 2023-02-14 LAB — COPPER, SERUM: Copper: 95 ug/dL (ref 70–175)

## 2023-02-14 LAB — B12 AND FOLATE PANEL
Folate: 7.7 ng/mL
Vitamin B-12: 462 pg/mL (ref 200–1100)

## 2023-02-14 LAB — PARATHYROID HORMONE, INTACT (NO CA): PTH: 56 pg/mL (ref 16–77)

## 2023-02-18 ENCOUNTER — Encounter: Payer: Self-pay | Admitting: Neurology

## 2023-02-24 ENCOUNTER — Ambulatory Visit
Admission: RE | Admit: 2023-02-24 | Discharge: 2023-02-24 | Disposition: A | Discharge status: Home / Self Care | Payer: 59 | Source: Ambulatory Visit | Attending: Neurology | Admitting: Neurology

## 2023-02-24 DIAGNOSIS — R292 Abnormal reflex: Secondary | ICD-10-CM

## 2023-02-24 DIAGNOSIS — M21371 Foot drop, right foot: Secondary | ICD-10-CM

## 2023-02-24 DIAGNOSIS — R29898 Other symptoms and signs involving the musculoskeletal system: Secondary | ICD-10-CM

## 2023-02-24 DIAGNOSIS — R531 Weakness: Secondary | ICD-10-CM

## 2023-02-27 ENCOUNTER — Ambulatory Visit: Payer: 59 | Admitting: Neurology

## 2023-02-27 DIAGNOSIS — M21371 Foot drop, right foot: Secondary | ICD-10-CM | POA: Diagnosis not present

## 2023-02-27 DIAGNOSIS — R531 Weakness: Secondary | ICD-10-CM

## 2023-02-27 DIAGNOSIS — R29898 Other symptoms and signs involving the musculoskeletal system: Secondary | ICD-10-CM

## 2023-02-27 DIAGNOSIS — R292 Abnormal reflex: Secondary | ICD-10-CM

## 2023-02-27 DIAGNOSIS — M21372 Foot drop, left foot: Secondary | ICD-10-CM | POA: Diagnosis not present

## 2023-02-27 NOTE — Procedures (Signed)
 [facility]  [address], [address]  [place], [place] [address] Tel: [phone] Fax: [phone] Test Date:  02/27/2023  Patient: Darrell Griffin DOB: 01-23-1945 Physician: Tonita Blanch, DO  Sex: Male Height: 6' 1 Ref Phys: Tonita Blanch, DO  ID#: [id number]   Technician:    History: This is a 78 year old man referred for evaluation of hand weakness.  NCV & EMG Findings: Extensive electrodiagnostic testing of the right upper extremity and additional studies of the left shows:  Right median sensory response shows prolonged latency (4.1 ms) and reduced amplitude (7.3 V).  Left ulnar sensory response shows prolonged peak latency (3.3 ms).  Right ulnar and left median sensory responses are within normal limits. Bilateral median and right ulnar motor responses are within normal limits.  Left ulnar motor response shows slowed conduction velocity across the elbow (A Elbow-B Elbow, 37 m/s Chronic motor axonal loss changes are seen affecting nearly all the tested muscles of the upper extremities involving the C5-C8 myotomes.  Sparse active denervation is seen in 3 of the 14 tested muscles. Fibrillation potentials are not seen in the thoracic paraspinal muscles.  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 Right median neuropathy at or distal to the wrist, consistent with a clinical diagnosis of carpal tunnel syndrome.  Overall, these findings are moderate in degree electrically. Left ulnar neuropathy with slowing across the elbow, demyelinating, moderate.     ___________________________ Tonita Blanch, DO    Nerve Conduction Studies   Stim Site NR Peak (ms) Norm Peak (ms) O-P Amp (V) Norm O-P Amp  Left Median Anti Sensory (2nd Digit)  32 C  Wrist    3.6 <3.8 14.6 >10  Right Median Anti Sensory (2nd Digit)  32 C  Wrist    *4.1 <3.8 *7.3 >10  Left Ulnar Anti Sensory (5th Digit)  32 C  Wrist     *3.3 <3.2 6.3 >5  Right Ulnar Anti Sensory (5th Digit)  32 C  Wrist    3.1 <3.2 7.7 >5     Stim Site NR Onset (ms) Norm Onset (ms) O-P Amp (mV) Norm O-P Amp Site1 Site2 Delta-0 (ms) Dist (cm) Vel (m/s) Norm Vel (m/s)  Left Median Motor (Abd Poll Brev)  32 C  Wrist    3.3 <4.0 11.5 >5 Elbow Wrist 6.3 32.0 51 >50  Elbow    9.6  11.1         Right Median Motor (Abd Poll Brev)  32 C  Wrist    3.8 <4.0 8.1 >5 Elbow Wrist 5.7 32.0 56 >50  Elbow    9.5  7.6         Left Ulnar Motor (Abd Dig Minimi)  32 C  Wrist    2.7 <3.1 8.7 >7 B Elbow Wrist 4.7 26.0 55 >50  B Elbow    7.4  8.5  A Elbow B Elbow 2.7 10.0 *37 >50  A Elbow    10.1  8.2         Right Ulnar Motor (Abd Dig Minimi)  32 C  Wrist    2.6 <3.1 8.3 >7 B Elbow Wrist 4.9 25.0 51 >50  B Elbow    7.5  7.6  A Elbow B Elbow 1.9 10.0 53 >50  A Elbow    9.4  7.3          Electromyography   Side Muscle Ins.Act Fibs Fasc Recrt Amp Dur Poly Activation Comment  Right  1stDorInt *1+ Nml Nml *1- *1+ *1+ *1+ Nml N/A  Right Abd Poll Brev Nml Nml Nml *1- *1+ *1+ *1+ Nml N/A  Right PronatorTeres Nml Nml Nml *1- *1+ *1+ *1+ Nml N/A  Right Biceps Nml Nml Nml *2- *1+ *1+ *1+ Nml N/A  Right Triceps Nml Nml Nml Nml Nml Nml Nml Nml N/A  Right Deltoid Nml Nml Nml *2- *1+ *1+ *1+ Nml N/A  Right Thoracic Parasp Mid Nml Nml Nml Nml *- *- *- Nml N/A  Right Cervical Parasp Low Nml Nml Nml Nml *- *- *- Nml N/A  Left PronatorTeres Nml Nml Nml *1- *1+ *1+ *1+ Nml N/A  Left 1stDorInt Nml *1+ Nml *2- *1+ *1+ *1+ Nml N/A  Left Deltoid Nml Nml *1+ *2- *1+ *1+ *1+ Nml N/A  Left Biceps Nml Nml Nml *1- *1+ *1+ *1+ Nml N/A  Left Triceps Nml *1+ Nml *2- *1+ *1+ *1+ Nml N/A  Left FlexCarpiUln Nml Nml Nml *1- *1+ *1+ *1+ Nml N/A      Waveforms:

## 2023-03-18 ENCOUNTER — Ambulatory Visit: Payer: 59 | Admitting: Neurology

## 2023-03-18 ENCOUNTER — Encounter: Payer: Self-pay | Admitting: Neurology

## 2023-03-18 VITALS — BP 146/67 | HR 76 | Ht 73.0 in | Wt 254.0 lb

## 2023-03-18 DIAGNOSIS — G1229 Other motor neuron disease: Secondary | ICD-10-CM | POA: Diagnosis not present

## 2023-03-18 NOTE — Progress Notes (Signed)
 Follow-up Visit   Date: 03/18/2023    Darrell Griffin MRN: [id number] DOB: 10-01-1945    Darrell Griffin is a 78 y.o. right-handed Caucasian male with hypertension, hypothyroidism, MGUS, and GERD returning to the clinic for follow-up of hyperCKemia and bilateral food drop.  The patient was accompanied to the clinic by daughter and wife who also provides collateral information.    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  NCS/EMG shows diffusely low motor responses with active on chronic neurogenic changes affecting nearly all the tested muscles in the lumbar segment. There is no associated nerve compression on MRI lumbar spine.  EMG of the upper extremity shows diffuse neurogenic changes in the cervical segment with sparse active changes.  Serology testing has been normal. Taken together, his symptoms are most suggestive of lower motor neuron disease.  There are no upper motor neuron findings on his exam.  The above with discussed with patient.  At this juncture, I recommend that he start PT for bilateral foot drop and we continue to monitor him.  If there are any changes, he will contact me.  I will see him back in 3 months, or sooner as needed.  --------------------------------------------- History of present illness: Daughter reports that he fell while walking a dog in 2023 and fractured right shoulder. Over the past year, she also started noticing that he was lean over when walking and would not pick up his feet normally when walking.  He has difficulty with standing up from low chairs and sometimes would need to manually raise his legs.  He reports having discomfort in the thigh, described as achy.  He reports having a similar spells several years ago, which resolved without treatment.  Symptoms have gradually improved over the past three weeks and he no longer has thigh pain or leg weakness.  He  continues to have weakness in the feet.  He has muscle cramps, no dark colored urine.  No numbness/tingling.   UPDATE 02/07/2023:  He is here for follow-up visit with his daughter.  At his last visit, I ordered myositis panel which was normal and SPEP with IFE, the latter which showed IgG lambda and IgG kappa monoclonal protein for which he was referred to hematology.  His evaluation is consistent with MGUS.  He also had NCS/EMG of the legs last week which shows diffuse active on chronic motor axon loss changes affecting nearly all the muscles. Sensory responses were normal.    He no longer has pain in the thighs.  He is also able to raise his legs while standing to put pants on, where as previously, he was sitting to do this.   No interval falls.  He has not started physical therapy.    He has muscle cramps.  No muscle twitches, arm weakness, speech/swallow difficulty.   UPDATE 03/18/2023: He is here for follow-up visit to discuss results of EMG and MRI lumbar spine.  MRI lumbar spine did not show nerve impingement and overall looked good.  NCS/EMG of the arms shows multilevel intraspinal canal lesion process affecting the C5-C8 myotomes.  There was also evidence of right CTS and left ulnar neuropathy.  He denies any new weakness, speech/swallow difficulty, or muscle twitches.  He occasionally had leg cramps. He has not start PT for leg strengthening yet.   Medications:  Current Outpatient Medications on File Prior to Visit  Medication Sig Dispense Refill   cyanocobalamin (VITAMIN  B12) 1000 MCG tablet Take 1,000 mcg by mouth daily.     levothyroxine (SYNTHROID) 200 MCG tablet TAKE 1 TABLET BY MOUTH EVERY DAY WITH (Patient taking differently: TAKE 1 TABLET BY MOUTH EVERY DAY WITH ) 90 tablet 3   lisinopril (ZESTRIL) 40 MG tablet TAKE 1 TABLET BY MOUTH EVERY DAY 90 tablet 3   No current facility-administered medications on file prior to visit.    Allergies: No Known Allergies  Vital  Signs:  BP (!) 146/67   Pulse 76   Ht 6\' 1"  (1.854 m)   Wt 254 lb (115.2 kg)   SpO2 97%   BMI 33.51 kg/m     Neurological Exam: MENTAL STATUS including orientation to time, place, person, recent and remote memory, attention span and concentration, language, and fund of knowledge is normal.  Speech is not dysarthric.  CRANIAL NERVES:  Pupils equal round and reactive to light.  Normal conjugate, extra-ocular eye movements in all directions of gaze.  No ptosis.  Face is symmetric. Palate elevates symmetrically.  Tongue is midline, no fasciculations.  MOTOR:  Mild atrophy of the lower legs (TA and gastroc) bilaterally.  No fasciculations or abnormal movements.  No pronator drift.    Upper Extremity:  Right   Left  Deltoid  5/5    5/5   Biceps  5/5    5/5   Triceps  5/5    5/5   Infraspinatus 5-/5   5-/5  Medial pectoralis 5/5   5/5  Wrist extensors  5/5    5/5   Wrist flexors  5/5    5/5   Finger extensors  5/5    5/5   Finger flexors  5/5    5/5   Dorsal interossei  5/5    5/5   Abductor pollicis  5/5    5/5   Tone (Ashworth scale)  0   0    Lower Extremity:  Right   Left  Hip flexors  4/5    4/5   Hip extensors  5/5    5/5   Adductor 5/5   5/5  Abductor 5/5   5/5  Knee flexors  5/5    5/5   Knee extensors  5/5    5/5   Dorsiflexors  4-/5    4-/5   Plantarflexors  4/5    4/5   Toe extensors  4/5    4/5   Toe flexors  4/5    4/5   Tone (Ashworth scale)  0   0    MSRs:                                              Right        Left brachioradialis 1+   1+  biceps 1+   1+  triceps 1+   1+  patellar 2+   2+  ankle jerk 0   0  Hoffman no   no  plantar response down   down   COORDINATION/GAIT:  Mild steppage gait bilaterally. Stable, unassisted.   Data: NCS/EMG of the legs 01/30/2023: The electrophysiologic findings are most consistent with an active on chronic intraspinal canal lesion (i.e. radiculopathy, anterior horn cells, etc) affecting the L2-S1 nerve  root/segments.   In particular, there is no evidence of a sensorimotor polyneuropathy or diffuse myopathy.  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 Right median neuropathy at or distal to the wrist, consistent with a clinical diagnosis of carpal tunnel syndrome.  Overall, these findings are moderate in degree electrically. Left ulnar neuropathy with slowing across the elbow, demyelinating, moderate.   Lab Results  Component Value Date   CKTOTAL 693 (H) 12/18/2022   CKMB 3.6 08/30/2009   TROPONINI < 0.02 01/27/2012   Lab Results  Component Value Date   ESRSEDRATE 2 02/07/2023   Lab Results  Component Value Date   CRP <3.0 02/07/2023   Lab Results  Component Value Date   VITAMINB12 462 02/07/2023   MRI lumbar spine wo contrast 03/10/2023:  Mild lumbar spondylosis without central canal or foraminal narrowing.  Total time spent reviewing records, interview, history/exam, documentation, and coordination of care on day of encounter:  45 min    Thank you for allowing me to participate in patient's care.  If I can answer any additional questions, I would be pleased to do so.    Sincerely,    Vania Rosero K. Allena Katz, DO

## 2023-04-21 ENCOUNTER — Encounter: Payer: BC Managed Care – PPO | Admitting: Internal Medicine

## 2023-06-11 ENCOUNTER — Ambulatory Visit: Payer: 59 | Admitting: Neurology

## 2023-06-26 ENCOUNTER — Inpatient Hospital Stay: Payer: BC Managed Care – PPO | Attending: Internal Medicine

## 2023-06-26 DIAGNOSIS — E538 Deficiency of other specified B group vitamins: Secondary | ICD-10-CM | POA: Insufficient documentation

## 2023-06-26 DIAGNOSIS — D472 Monoclonal gammopathy: Secondary | ICD-10-CM | POA: Diagnosis present

## 2023-06-26 LAB — CBC WITH DIFFERENTIAL (CANCER CENTER ONLY)
Abs Immature Granulocytes: 0.05 10*3/uL (ref 0.00–0.07)
Basophils Absolute: 0.1 10*3/uL (ref 0.0–0.1)
Basophils Relative: 1 %
Eosinophils Absolute: 0.2 10*3/uL (ref 0.0–0.5)
Eosinophils Relative: 2 %
HCT: 45.7 % (ref 39.0–52.0)
Hemoglobin: 15 g/dL (ref 13.0–17.0)
Immature Granulocytes: 1 %
Lymphocytes Relative: 35 %
Lymphs Abs: 3.4 10*3/uL (ref 0.7–4.0)
MCH: 28.8 pg (ref 26.0–34.0)
MCHC: 32.8 g/dL (ref 30.0–36.0)
MCV: 87.7 fL (ref 80.0–100.0)
Monocytes Absolute: 0.7 10*3/uL (ref 0.1–1.0)
Monocytes Relative: 7 %
Neutro Abs: 5.4 10*3/uL (ref 1.7–7.7)
Neutrophils Relative %: 54 %
Platelet Count: 164 10*3/uL (ref 150–400)
RBC: 5.21 MIL/uL (ref 4.22–5.81)
RDW: 13.2 % (ref 11.5–15.5)
WBC Count: 9.9 10*3/uL (ref 4.0–10.5)
nRBC: 0 % (ref 0.0–0.2)

## 2023-06-26 LAB — CMP (CANCER CENTER ONLY)
ALT: 48 U/L — ABNORMAL HIGH (ref 0–44)
AST: 46 U/L — ABNORMAL HIGH (ref 15–41)
Albumin: 4.4 g/dL (ref 3.5–5.0)
Alkaline Phosphatase: 36 U/L — ABNORMAL LOW (ref 38–126)
Anion gap: 9 (ref 5–15)
BUN: 15 mg/dL (ref 8–23)
CO2: 23 mmol/L (ref 22–32)
Calcium: 8.4 mg/dL — ABNORMAL LOW (ref 8.9–10.3)
Chloride: 110 mmol/L (ref 98–111)
Creatinine: 0.99 mg/dL (ref 0.61–1.24)
GFR, Estimated: >60 mL/min (ref >60)
Glucose, Bld: 102 mg/dL — ABNORMAL HIGH (ref 70–99)
Potassium: 4.3 mmol/L (ref 3.5–5.1)
Sodium: 142 mmol/L (ref 135–145)
Total Bilirubin: 1.3 mg/dL — ABNORMAL HIGH (ref 0.0–1.2)
Total Protein: 6.7 g/dL (ref 6.5–8.1)

## 2023-07-10 ENCOUNTER — Ambulatory Visit: Payer: BC Managed Care – PPO | Admitting: Internal Medicine

## 2023-07-11 ENCOUNTER — Other Ambulatory Visit: Payer: Self-pay | Admitting: *Deleted

## 2023-07-11 ENCOUNTER — Ambulatory Visit: Payer: Self-pay | Admitting: Internal Medicine

## 2023-07-11 DIAGNOSIS — D472 Monoclonal gammopathy: Secondary | ICD-10-CM

## 2023-07-15 ENCOUNTER — Inpatient Hospital Stay: Payer: Self-pay | Admitting: Internal Medicine

## 2023-07-15 ENCOUNTER — Encounter: Payer: Self-pay | Admitting: Neurology

## 2023-07-15 ENCOUNTER — Encounter: Payer: Self-pay | Admitting: Internal Medicine

## 2023-07-15 ENCOUNTER — Ambulatory Visit: Admitting: Neurology

## 2023-07-15 VITALS — BP 135/65 | HR 71 | Ht 73.0 in | Wt 247.0 lb

## 2023-07-15 VITALS — BP 127/53 | HR 65 | Temp 98.4°F | Resp 20 | Ht 73.0 in | Wt 248.2 lb

## 2023-07-15 DIAGNOSIS — M21371 Foot drop, right foot: Secondary | ICD-10-CM

## 2023-07-15 DIAGNOSIS — G1229 Other motor neuron disease: Secondary | ICD-10-CM | POA: Diagnosis not present

## 2023-07-15 DIAGNOSIS — D472 Monoclonal gammopathy: Secondary | ICD-10-CM

## 2023-07-15 DIAGNOSIS — M21372 Foot drop, left foot: Secondary | ICD-10-CM | POA: Diagnosis not present

## 2023-07-15 NOTE — Patient Instructions (Signed)
 Start physical therapy for leg and feet strengthening

## 2023-07-15 NOTE — Assessment & Plan Note (Addendum)
#   MGUS:  SPEP/IFE showed 2 bands 0.2 g/dL, faint IgG lambda and IgG kappa monoclonal protein.  CBC and CMP is unremarkable.  Denies any neuropathy.  Denies bone pain.  No strong indication for bone imaging.  Again reviewed the low likelihood of any progressive monoclonal gammopathy at this time.  This is unrelated to his lower motor neuron disease.  See discussion below  # iatrogenic Hypothyroidism- s/p surgery- on synthroid -    # Hypertension-on lisinopril  40 mg daily  # Low vitamin B12 level-on B12 supplements 1000 mcg daily  # Lower motor disease [Dr.Donika Patel, [facility]]- improving- planning PT.   # DISPOSITION: # follow up in 7 months- MD;PRIOR 2 weeks-cbc/cmp; MM panel; K/l light chains; b12 levels-Dr.B

## 2023-07-15 NOTE — Progress Notes (Signed)
 [facility] CONSULT NOTE  Patient Care Team: Katrinka Garnette KIDD, MD as PCP - General (Family Medicine) Tobie Tonita POUR, DO as Consulting Physician (Neurology) Jeannetta Lonni ORN, MD as Attending Physician (Rheumatology) Rennie Cindy SAUNDERS, MD as Consulting Physician (Oncology)  CHIEF COMPLAINTS/PURPOSE OF CONSULTATION: MGUS  @onchx   HISTORY OF PRESENTING ILLNESS:  Darrell Griffin 78 y.o.  male pleasant patient with a history of MGUS and lower motor neuron disease is here for follow-up.  Patient denies any new onset tingling or numbness.  Patient has chronic back pain and also weakness of his lower extremity expected of his lower motor neuron disease.  He is currently being followed closely by neurology.  Review of Systems  Constitutional:  Negative for chills, diaphoresis, fever, malaise/fatigue and weight loss.  HENT:  Negative for nosebleeds and sore throat.   Eyes:  Negative for double vision.  Respiratory:  Negative for cough, hemoptysis, sputum production, shortness of breath and wheezing.   Cardiovascular:  Negative for chest pain, palpitations, orthopnea and leg swelling.  Gastrointestinal:  Negative for abdominal pain, blood in stool, constipation, diarrhea, heartburn, melena, nausea and vomiting.  Genitourinary:  Negative for dysuria, frequency and urgency.  Musculoskeletal:  Positive for joint pain.  Skin: Negative.  Negative for itching and rash.  Neurological:  Negative for dizziness, tingling, focal weakness, weakness and headaches.  Endo/Heme/Allergies:  Does not bruise/bleed easily.  Psychiatric/Behavioral:  Negative for depression. The patient is not nervous/anxious and does not have insomnia.     MEDICAL HISTORY:  Past Medical History:  Diagnosis Date   GERD (gastroesophageal reflux disease)    Hypertension    Hypothyroidism     SURGICAL HISTORY: Past Surgical History:  Procedure Laterality Date   APPENDECTOMY  05/09/2022   Emergency  appendectomy   THYROIDECTOMY     cold spot, ultimately benign    SOCIAL HISTORY: Social History   Socioeconomic History   Marital status: Married    Spouse name: Not on file   Number of children: Not on file   Years of education: Not on file   Highest education level: Not on file  Occupational History   Not on file  Tobacco Use   Smoking status: Never   Smokeless tobacco: Never  Substance and Sexual Activity   Alcohol use: No    Alcohol/week: 0.0 standard drinks of alcohol   Drug use: No   Sexual activity: Not on file  Other Topics Concern   Not on file  Social History Narrative   Married. Daughter 54 in 2016. 5 granddaughters- 2 are stepdaughters.       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 Right Handed    Are you currently employed ? Yes    What is your current occupation? [employer] office    Do you live at home alone? No   Who lives with you? Lives with wife   What type of home do you live in: 1 story or 2 story? Lives in a one story home apartment       Social Drivers of Health   Financial Resource Strain: Not on file  Food Insecurity: No Food Insecurity (01/09/2023)   Hunger Vital Sign    Worried About Running Out of Food in the Last Year: Never true    Ran Out of Food in the  Last Year: Never true  Transportation Needs: No Transportation Needs (01/09/2023)   PRAPARE - Administrator, Civil Service (Medical): No    Lack of Transportation (Non-Medical): No  Physical Activity: Not on file  Stress: Not on file  Social Connections: Not on file  Intimate Partner Violence: Not At Risk (01/09/2023)   Humiliation, Afraid, Rape, and Kick questionnaire    Fear of Current or Ex-Partner: No    Emotionally Abused: No    Physically Abused: No    Sexually Abused: No    FAMILY HISTORY: Family History  Problem Relation Age of Onset   Breast  cancer Mother            Hypertension Mother    Multiple myeloma Mother    Congestive Heart Failure Mother    Seizures Father        58 passed away   Diabetes type II Sister    Hypertension Brother    Diabetes type II Brother    Colon cancer Neg Hx    Esophageal cancer Neg Hx    Rectal cancer Neg Hx    Stomach cancer Neg Hx     ALLERGIES:  has no known allergies.  MEDICATIONS:  Current Outpatient Medications  Medication Sig Dispense Refill   cyanocobalamin  (VITAMIN B12) 1000 MCG tablet Take 1,000 mcg by mouth daily.     levothyroxine  (SYNTHROID ) 200 MCG tablet TAKE 1 TABLET BY MOUTH EVERY DAY WITH 25MCG 90 tablet 3   lisinopril  (ZESTRIL ) 40 MG tablet TAKE 1 TABLET BY MOUTH EVERY DAY 90 tablet 3   No current facility-administered medications for this visit.    PHYSICAL EXAMINATION:   Vitals:   07/15/23 1249  BP: (!) 127/53  Pulse: 65  Resp: 20  Temp: 98.4 F (36.9 C)  SpO2: 99%   Filed Weights   07/15/23 1249  Weight: 248 lb 3.2 oz (112.6 kg)    Physical Exam Vitals and nursing note reviewed.  HENT:     Head: Normocephalic and atraumatic.     Mouth/Throat:     Pharynx: Oropharynx is clear.  Eyes:     Extraocular Movements: Extraocular movements intact.     Pupils: Pupils are equal, round, and reactive to light.  Cardiovascular:     Rate and Rhythm: Normal rate and regular rhythm.  Pulmonary:     Comments: Decreased breath sounds bilaterally.  Abdominal:     Palpations: Abdomen is soft.  Musculoskeletal:        General: Normal range of motion.     Cervical back: Normal range of motion.  Skin:    General: Skin is warm.  Neurological:     General: No focal deficit present.     Mental Status: He is alert and oriented to person, place, and time.  Psychiatric:        Behavior: Behavior normal.        Judgment: Judgment normal.     LABORATORY DATA:  I have reviewed the data as listed Lab Results  Component Value Date   WBC 9.9 06/26/2023   HGB  15.0 06/26/2023   HCT 45.7 06/26/2023   MCV 87.7 06/26/2023   PLT 164 06/26/2023   Recent Labs    12/18/22 0907 12/24/22 1505 01/09/23 1154 06/26/23 0813  NA 141  --  140 142  K 4.5  --  4.0 4.3  CL 107  --  105 110  CO2 28  --  24 23  GLUCOSE 96  --  98 102*  BUN 15  --  14 15  CREATININE 0.92  --  0.92 0.99  CALCIUM 8.4  --  8.5* 8.4*  GFRNONAA  --   --  >60 >60  PROT 6.2 6.3 6.8 6.7  ALBUMIN 4.5  --  4.4 4.4  AST 37  --  42* 46*  ALT 40  --  47* 48*  ALKPHOS 41  --  40 36*  BILITOT 1.1  --  1.2* 1.3*    RADIOGRAPHIC STUDIES: I have personally reviewed the radiological images as listed and agreed with the findings in the report. No results found.   MGUS (monoclonal gammopathy of unknown significance) # MGUS:  SPEP/IFE showed 2 bands 0.2 g/dL, faint IgG lambda and IgG kappa monoclonal protein.  CBC and CMP is unremarkable.  Denies any neuropathy.  Denies bone pain.  No strong indication for bone imaging.  Again reviewed the low likelihood of any progressive monoclonal gammopathy at this time.  This is unrelated to his lower motor neuron disease.  See discussion below  # iatrogenic Hypothyroidism- s/p surgery- on synthroid -    # Hypertension-on lisinopril  40 mg daily  # Low vitamin B12 level-on B12 supplements 1000 mcg daily  # Lower motor disease [Dr.Donika Patel, Neuolorgy]- improving- planning PT.   # DISPOSITION: # follow up in 7 months- MD;PRIOR 2 weeks-cbc/cmp; MM panel; K/l light chains; b12 levels-Dr.B     Above plan of care was discussed with patient/family in detail.  My contact information was given to the patient/family.       Cindy JONELLE Joe, MD 07/28/2023 2:07 PM

## 2023-07-15 NOTE — Progress Notes (Signed)
 Follow-up Visit   Date: 07/15/2023    Darrell Griffin MRN: [id number] DOB: 02/07/1945    Darrell Griffin is a 78 y.o. right-handed Caucasian male with hypertension, hypothyroidism, MGUS, and GERD returning to the clinic for follow-up of hyperCKemia and bilateral food drop.  The patient was accompanied to the clinic by daughter and wife who also provides collateral information.    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  Today, exam shows improved hip flexor strength and stable weakness in the feet.  There are no upper motor neuron findings or fasciculations.  NCS/EMG shows diffusely low motor responses with active on chronic neurogenic changes affecting nearly all the tested muscles in the lumbar segment. There is no associated nerve compression on MRI lumbar spine.  EMG of the upper extremity shows diffuse neurogenic changes in the cervical segment with sparse active changes.    Start PT for bilateral foot drop and leg strengthening Continue to monitor  Return to clinic in 4 months or sooner as needed  --------------------------------------------- History of present illness: Daughter reports that he fell while walking a dog in 2023 and fractured right shoulder. Over the past year, she also started noticing that he was lean over when walking and would not pick up his feet normally when walking.  He has difficulty with standing up from low chairs and sometimes would need to manually raise his legs.  He reports having discomfort in the thigh, described as achy.  He reports having a similar spells several years ago, which resolved without treatment.  Symptoms have gradually improved over the past three weeks and he no longer has thigh pain or leg weakness.  He continues to have weakness in the feet.  He has muscle cramps, no dark colored urine.  No numbness/tingling.   UPDATE 02/07/2023:  He is here for follow-up  visit with his daughter.  At his last visit, I ordered myositis panel which was normal and SPEP with IFE, the latter which showed IgG lambda and IgG kappa monoclonal protein for which he was referred to hematology.  His evaluation is consistent with MGUS.  He also had NCS/EMG of the legs last week which shows diffuse active on chronic motor axon loss changes affecting nearly all the muscles. Sensory responses were normal.    He no longer has pain in the thighs.  He is also able to raise his legs while standing to put pants on, where as previously, he was sitting to do this.   No interval falls.  He has not started physical therapy.    He has muscle cramps.  No muscle twitches, arm weakness, speech/swallow difficulty.   UPDATE 03/18/2023: He is here for follow-up visit to discuss results of EMG and MRI lumbar spine.  MRI lumbar spine did not show nerve impingement and overall looked good.  NCS/EMG of the arms shows multilevel intraspinal canal lesion process affecting the C5-C8 myotomes.  There was also evidence of right CTS and left ulnar neuropathy.  He denies any new weakness, speech/swallow difficulty, or muscle twitches.  He occasionally had leg cramps. He has not start PT for leg strengthening yet.   UPDATE 07/15/2023:  He is here for follow-up visit.  He has been doing well and denies any new weakness, muscle twitches, or arm weakness.  He continues to have weakness in the feet, which is stable.  He has not done PT yet.  No new complaints.  Medications:  Current Outpatient Medications on File Prior to Visit  Medication Sig Dispense Refill   cyanocobalamin  (VITAMIN B12) 1000 MCG tablet Take 1,000 mcg by mouth daily.     levothyroxine  (SYNTHROID ) 200 MCG tablet TAKE 1 TABLET BY MOUTH EVERY DAY WITH 25MCG 90 tablet 3   lisinopril  (ZESTRIL ) 40 MG tablet TAKE 1 TABLET BY MOUTH EVERY DAY 90 tablet 3   No current facility-administered medications on file prior to visit.    Allergies: No Known  Allergies  Vital Signs:  BP 135/65   Pulse 71   Ht 6' 1 (1.854 m)   Wt 247 lb (112 kg)   SpO2 98%   BMI 32.59 kg/m     Neurological Exam: MENTAL STATUS including orientation to time, place, person, recent and remote memory, attention span and concentration, language, and fund of knowledge is normal.  Speech is not dysarthric.  CRANIAL NERVES:  Pupils equal round and reactive to light.  Normal conjugate, extra-ocular eye movements in all directions of gaze.  No ptosis.  Face is symmetric. Palate elevates symmetrically.  Tongue is midline, no fasciculations.  MOTOR:  Mild atrophy of the lower legs (TA and gastroc) bilaterally.  No fasciculations or abnormal movements.  No pronator drift.    Upper Extremity:  Right   Left  Deltoid  5/5    5/5   Biceps  5/5    5/5   Triceps  5/5    5/5   Infraspinatus 5-/5   5-/5  Medial pectoralis 5/5   5/5  Wrist extensors  5/5    5/5   Wrist flexors  5/5    5/5   Finger extensors  5/5    5/5   Finger flexors  5/5    5/5   Dorsal interossei  5/5    5/5   Abductor pollicis  5/5    5/5   Tone (Ashworth scale)  0   0    Lower Extremity:  Right   Left  Hip flexors *improved 5/5    5/5   Hip extensors  5/5    5/5   Adductor 5/5   5/5  Abductor 5/5   5/5  Knee flexors  5/5    5/5   Knee extensors  5/5    5/5   Dorsiflexors  4-/5    4-/5   Plantarflexors  4/5    4/5   Toe extensors  4/5    4/5   Toe flexors  4/5    4/5   Tone (Ashworth scale)  0   0    MSRs:                                              Right        Left brachioradialis 1+   1+  biceps 1+   1+  triceps 1+   1+  patellar 2+   2+  ankle jerk 0   0  Hoffman no   no  plantar response down   down   COORDINATION/GAIT:  Mild steppage gait bilaterally. Stable, unassisted.  Easily able to stand up without using arms to push off.   Data: NCS/EMG of the legs 01/30/2023: The electrophysiologic findings are most consistent with an active on chronic intraspinal canal lesion (i.e.  radiculopathy, anterior horn cells, etc) affecting the L2-S1 nerve root/segments.  In particular, there is no evidence of a sensorimotor polyneuropathy or diffuse myopathy.  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 Right median neuropathy at or distal to the wrist, consistent with a clinical diagnosis of carpal tunnel syndrome.  Overall, these findings are moderate in degree electrically. Left ulnar neuropathy with slowing across the elbow, demyelinating, moderate.   Lab Results  Component Value Date   CKTOTAL 693 (H) 12/18/2022   CKMB 3.6 08/30/2009   TROPONINI < 0.02 01/27/2012   Lab Results  Component Value Date   ESRSEDRATE 2 02/07/2023   Lab Results  Component Value Date   CRP <3.0 02/07/2023   Lab Results  Component Value Date   VITAMINB12 462 02/07/2023   MRI lumbar spine wo contrast 03/10/2023:  Mild lumbar spondylosis without central canal or foraminal narrowing.    Thank you for allowing me to participate in patient's care.  If I can answer any additional questions, I would be pleased to do so.    Sincerely,    Nirvi Boehler K. Tobie, DO

## 2023-07-15 NOTE — Progress Notes (Signed)
 MRI l-spine 02/24/23. Seeing neurology for lower extremity motor issues.

## 2023-11-05 ENCOUNTER — Ambulatory Visit: Admitting: Neurology

## 2023-12-03 ENCOUNTER — Other Ambulatory Visit: Payer: Self-pay | Admitting: Family Medicine

## 2024-01-29 ENCOUNTER — Other Ambulatory Visit: Payer: Self-pay | Admitting: *Deleted

## 2024-01-29 DIAGNOSIS — D472 Monoclonal gammopathy: Secondary | ICD-10-CM

## 2024-01-30 ENCOUNTER — Inpatient Hospital Stay: Attending: Internal Medicine

## 2024-01-30 DIAGNOSIS — Z79899 Other long term (current) drug therapy: Secondary | ICD-10-CM | POA: Diagnosis not present

## 2024-01-30 DIAGNOSIS — G629 Polyneuropathy, unspecified: Secondary | ICD-10-CM | POA: Diagnosis not present

## 2024-01-30 DIAGNOSIS — D472 Monoclonal gammopathy: Secondary | ICD-10-CM | POA: Insufficient documentation

## 2024-01-30 DIAGNOSIS — G1220 Motor neuron disease, unspecified: Secondary | ICD-10-CM | POA: Insufficient documentation

## 2024-01-30 DIAGNOSIS — E538 Deficiency of other specified B group vitamins: Secondary | ICD-10-CM | POA: Diagnosis not present

## 2024-01-30 DIAGNOSIS — E039 Hypothyroidism, unspecified: Secondary | ICD-10-CM | POA: Insufficient documentation

## 2024-01-30 LAB — CBC WITH DIFFERENTIAL (CANCER CENTER ONLY)
Abs Immature Granulocytes: 0.05 K/uL (ref 0.00–0.07)
Basophils Absolute: 0.1 K/uL (ref 0.0–0.1)
Basophils Relative: 1 %
Eosinophils Absolute: 0.1 K/uL (ref 0.0–0.5)
Eosinophils Relative: 1 %
HCT: 41.5 % (ref 39.0–52.0)
Hemoglobin: 13.9 g/dL (ref 13.0–17.0)
Immature Granulocytes: 1 %
Lymphocytes Relative: 35 %
Lymphs Abs: 3.1 K/uL (ref 0.7–4.0)
MCH: 29.1 pg (ref 26.0–34.0)
MCHC: 33.5 g/dL (ref 30.0–36.0)
MCV: 86.8 fL (ref 80.0–100.0)
Monocytes Absolute: 0.7 K/uL (ref 0.1–1.0)
Monocytes Relative: 7 %
Neutro Abs: 5 K/uL (ref 1.7–7.7)
Neutrophils Relative %: 55 %
Platelet Count: 163 K/uL (ref 150–400)
RBC: 4.78 MIL/uL (ref 4.22–5.81)
RDW: 13 % (ref 11.5–15.5)
WBC Count: 9 K/uL (ref 4.0–10.5)
nRBC: 0 % (ref 0.0–0.2)

## 2024-01-30 LAB — CMP (CANCER CENTER ONLY)
ALT: 35 U/L (ref 0–44)
AST: 39 U/L (ref 15–41)
Albumin: 4.2 g/dL (ref 3.5–5.0)
Alkaline Phosphatase: 37 U/L — ABNORMAL LOW (ref 38–126)
Anion gap: 10 (ref 5–15)
BUN: 14 mg/dL (ref 8–23)
CO2: 22 mmol/L (ref 22–32)
Calcium: 8.6 mg/dL — ABNORMAL LOW (ref 8.9–10.3)
Chloride: 111 mmol/L (ref 98–111)
Creatinine: 0.91 mg/dL (ref 0.61–1.24)
GFR, Estimated: >60 mL/min
Glucose, Bld: 106 mg/dL — ABNORMAL HIGH (ref 70–99)
Potassium: 4.3 mmol/L (ref 3.5–5.1)
Sodium: 143 mmol/L (ref 135–145)
Total Bilirubin: 1 mg/dL (ref 0.0–1.2)
Total Protein: 6.4 g/dL — ABNORMAL LOW (ref 6.5–8.1)

## 2024-01-30 LAB — VITAMIN B12: Vitamin B-12: 664 pg/mL (ref 180–914)

## 2024-02-02 LAB — MULTIPLE MYELOMA PANEL, SERUM
Albumin SerPl Elph-Mcnc: 3.7 g/dL (ref 2.9–4.4)
Albumin/Glob SerPl: 1.8 — ABNORMAL HIGH (ref 0.7–1.7)
Alpha 1: 0.2 g/dL (ref 0.0–0.4)
Alpha2 Glob SerPl Elph-Mcnc: 0.5 g/dL (ref 0.4–1.0)
B-Globulin SerPl Elph-Mcnc: 0.9 g/dL (ref 0.7–1.3)
Gamma Glob SerPl Elph-Mcnc: 0.4 g/dL (ref 0.4–1.8)
Globulin, Total: 2.1 g/dL — ABNORMAL LOW (ref 2.2–3.9)
IgA: 127 mg/dL (ref 61–437)
IgG (Immunoglobin G), Serum: 688 mg/dL (ref 603–1613)
IgM (Immunoglobulin M), Srm: 16 mg/dL (ref 15–143)
M Protein SerPl Elph-Mcnc: 0.2 g/dL — ABNORMAL HIGH
Total Protein ELP: 5.8 g/dL — ABNORMAL LOW (ref 6.0–8.5)

## 2024-02-02 LAB — KAPPA/LAMBDA LIGHT CHAINS
Kappa free light chain: 21.6 mg/L — ABNORMAL HIGH (ref 3.3–19.4)
Kappa, lambda light chain ratio: 1.85 — ABNORMAL HIGH (ref 0.26–1.65)
Lambda free light chains: 11.7 mg/L (ref 5.7–26.3)

## 2024-02-13 ENCOUNTER — Inpatient Hospital Stay: Admitting: Internal Medicine

## 2024-02-13 ENCOUNTER — Encounter: Payer: Self-pay | Admitting: Internal Medicine

## 2024-02-13 VITALS — BP 142/59 | HR 61 | Temp 98.1°F | Resp 16 | Ht 73.0 in | Wt 248.0 lb

## 2024-02-13 DIAGNOSIS — D472 Monoclonal gammopathy: Secondary | ICD-10-CM

## 2024-02-13 NOTE — Assessment & Plan Note (Addendum)
#   MGUS:  SPEP/IFE showed 2 bands 0.2 g/dL, faint IgG lambda and IgG kappa monoclonal protein.  # MGUS overall stable- LOW RISK- risk of transformation to MM is 20% in next 20 years.patient interested in continued surveillance at this time.  Recommend surveillance every 12 months.   # iatrogenic Hypothyroidism- s/p surgery- on synthroid -    # Hypertension-on lisinopril  40 mg daily  # Low vitamin B12 level-on B12 supplements 1000 mcg daily  # Lower motor disease [Dr.Donika Patel, Neuolorgy]- improving- planning PT.   # DISPOSITION: # follow up in 12  months- MD;PRIOR 2 weeks-cbc/cmp; MM panel; K/l light chains; b12 levels-Dr.B

## 2024-02-13 NOTE — Progress Notes (Signed)
 Pt states he was sent here for issues with his legs and had labs but not sure if he still needs to come here.

## 2024-02-13 NOTE — Progress Notes (Signed)
 [facility] CONSULT NOTE  Patient Care Team: Katrinka Garnette KIDD, MD as PCP - General (Family Medicine) Tobie Tonita POUR, DO as Consulting Physician (Neurology) Jeannetta Lonni ORN, MD as Attending Physician (Rheumatology) Rennie Cindy SAUNDERS, MD as Consulting Physician (Oncology)  CHIEF COMPLAINTS/PURPOSE OF CONSULTATION: MGUS  @onchx   HISTORY OF PRESENTING ILLNESS:  Darrell Griffin 79 y.o.  male pleasant patient with a history of MGUS and lower motor neuron disease is here for follow-up.  Discussed the use of AI scribe software for clinical note transcription with the patient, who gave verbal consent to proceed.  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  He has been found to have an abnormal M protein in the blood and is being monitored for MGUS. He does not report bone pain.  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 He ambulates with a cane and reports stable symptoms, with no recent worsening, falls, or injuries. He states he is able to walk and feels his condition is stable.       Review of Systems  Constitutional:  Negative for chills, diaphoresis, fever, malaise/fatigue and weight loss.  HENT:  Negative for nosebleeds and sore throat.   Eyes:  Negative for double vision.  Respiratory:  Negative for cough, hemoptysis, sputum production, shortness of breath and wheezing.   Cardiovascular:  Negative for chest pain, palpitations, orthopnea and leg swelling.  Gastrointestinal:  Negative for abdominal pain, blood in stool, constipation, diarrhea, heartburn, melena, nausea and vomiting.  Genitourinary:  Negative for dysuria, frequency and urgency.  Musculoskeletal:  Positive for joint pain.  Skin: Negative.  Negative for itching and rash.  Neurological:  Negative  for dizziness, tingling, focal weakness, weakness and headaches.  Endo/Heme/Allergies:  Does not bruise/bleed easily.  Psychiatric/Behavioral:  Negative for depression. The patient is not nervous/anxious and does not have insomnia.     MEDICAL HISTORY:  Past Medical History:  Diagnosis Date   GERD (gastroesophageal reflux disease)    Hypertension    Hypothyroidism     SURGICAL HISTORY: Past Surgical History:  Procedure Laterality Date   APPENDECTOMY  05/09/2022   Emergency appendectomy   THYROIDECTOMY     cold spot, ultimately benign    SOCIAL HISTORY: Social History   Socioeconomic History   Marital status: Married    Spouse name: Not on file   Number of children: Not on file   Years of education: Not on file   Highest education level: Not on file  Occupational History   Not on file  Tobacco Use   Smoking status: Never   Smokeless tobacco: Never  Substance and Sexual Activity   Alcohol use: No    Alcohol/week: 0.0 standard drinks of alcohol   Drug use: No   Sexual activity: Not on file  Other Topics Concern   Not on file  Social History Narrative   Married. Daughter 68 in 2016. 5 granddaughters- 2 are stepdaughters.       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 Right Handed    Are you currently employed ? Yes    What is your current occupation? [employer] office    Do you live at  home alone? No   Who lives with you? Lives with wife   What type of home do you live in: 1 story or 2 story? Lives in a one story home apartment       Social Drivers of Health   Tobacco Use: Low Risk (02/13/2024)   Patient History    Smoking Tobacco Use: Never    Smokeless Tobacco Use: Never    Passive Exposure: Not on file  Financial Resource Strain: Low Risk  (01/14/2024)   Received from [facility]   Overall Financial Resource Strain (CARDIA)     Difficulty of Paying Living Expenses: Not hard at all  Food Insecurity: No Food Insecurity (01/14/2024)   Received from [facility]   Epic    Within the past 12 months, you worried that your food would run out before you got the money to buy more.: Never true    Within the past 12 months, the food you bought just didn't last and you didn't have money to get more.: Never true  Transportation Needs: No Transportation Needs (01/14/2024)   Received from [facility] - Transportation    In the past 12 months, has lack of transportation kept you from medical appointments or from getting medications?: No    Lack of Transportation (Non-Medical): No  Physical Activity: Not on file  Stress: Not on file  Social Connections: Not on file  Intimate Partner Violence: Not At Risk (01/09/2023)   Humiliation, Afraid, Rape, and Kick questionnaire    Fear of Current or Ex-Partner: No    Emotionally Abused: No    Physically Abused: No    Sexually Abused: No  Depression (PHQ2-9): Low Risk (02/13/2024)   Depression (PHQ2-9)    PHQ-2 Score: 0  Alcohol Screen: Not on file  Housing: Low Risk  (01/14/2024)   Received from [facility]   Epic    In the last 12 months, was there a time when you were not able to pay the mortgage or rent on time?: No    In the past 12 months, how many times have you moved where you were living?: 0    At any time in the past 12 months, were you homeless or living in a shelter (including now)?: No  Utilities: Not At Risk (01/14/2024)   Received from [facility]   Epic    In the past 12 months has the electric, gas, oil, or water company threatened to shut off services in your home?: No  Health Literacy: Not on file    FAMILY HISTORY: Family History  Problem Relation Age of Onset   Breast cancer Mother            Hypertension Mother    Multiple myeloma Mother    Congestive Heart Failure  Mother    Seizures Father        86 passed away   Diabetes type II Sister    Hypertension Brother    Diabetes type II Brother    Colon cancer Neg Hx    Esophageal cancer Neg Hx    Rectal cancer Neg Hx    Stomach cancer Neg Hx     ALLERGIES:  has no known allergies.  MEDICATIONS:  Current Outpatient Medications  Medication Sig Dispense Refill   cyanocobalamin  (VITAMIN B12) 1000 MCG tablet Take 1,000 mcg by mouth daily.     levothyroxine  (SYNTHROID ) 200 MCG tablet TAKE 1 TABLET BY  MOUTH EVERY DAY WITH (Patient taking differently: Take 200 mcg by mouth daily before breakfast.) 90 tablet 3   lisinopril  (ZESTRIL ) 40 MG tablet TAKE 1 TABLET BY MOUTH EVERY DAY 90 tablet 3   No current facility-administered medications for this visit.    PHYSICAL EXAMINATION:   Vitals:   02/13/24 1516 02/13/24 1524  BP: (!) 144/65 (!) 142/59  Pulse: 61   Resp: 16   Temp: 98.1 F (36.7 C)   SpO2: 99%    Filed Weights   02/13/24 1516  Weight: 248 lb (112.5 kg)    Physical Exam Vitals and nursing note reviewed.  HENT:     Head: Normocephalic and atraumatic.     Mouth/Throat:     Pharynx: Oropharynx is clear.  Eyes:     Extraocular Movements: Extraocular movements intact.     Pupils: Pupils are equal, round, and reactive to light.  Cardiovascular:     Rate and Rhythm: Normal rate and regular rhythm.  Pulmonary:     Comments: Decreased breath sounds bilaterally.  Abdominal:     Palpations: Abdomen is soft.  Musculoskeletal:        General: Normal range of motion.     Cervical back: Normal range of motion.  Skin:    General: Skin is warm.  Neurological:     General: No focal deficit present.     Mental Status: He is alert and oriented to person, place, and time.  Psychiatric:        Behavior: Behavior normal.        Judgment: Judgment normal.     LABORATORY DATA:  I have reviewed the data as listed Lab Results  Component Value Date   WBC 9.0 01/30/2024   HGB 13.9  01/30/2024   HCT 41.5 01/30/2024   MCV 86.8 01/30/2024   PLT 163 01/30/2024   Recent Labs    06/26/23 0813 01/30/24 0818  NA 142 143  K 4.3 4.3  CL 110 111  CO2 23 22  GLUCOSE 102* 106*  BUN 15 14  CREATININE 0.99 0.91  CALCIUM 8.4* 8.6*  GFRNONAA >60 >60  PROT 6.7 6.4*  ALBUMIN 4.4 4.2  AST 46* 39  ALT 48* 35  ALKPHOS 36* 37*  BILITOT 1.3* 1.0    RADIOGRAPHIC STUDIES: I have personally reviewed the radiological images as listed and agreed with the findings in the report. No results found.   MGUS (monoclonal gammopathy of unknown significance) # MGUS:  SPEP/IFE showed 2 bands 0.2 g/dL, faint IgG lambda and IgG kappa monoclonal protein.  # MGUS overall stable- LOW RISK- risk of transformation to MM is 20% in next 20 years.patient interested in continued surveillance at this time.  Recommend surveillance every 12 months.   # iatrogenic Hypothyroidism- s/p surgery- on synthroid -    # Hypertension-on lisinopril  40 mg daily  # Low vitamin B12 level-on B12 supplements 1000 mcg daily  # Lower motor disease [Dr.Donika Patel, Neu[facility]]- improving- planning PT.   # DISPOSITION: # follow up in 12  months- MD;PRIOR 2 weeks-cbc/cmp; MM panel; K/l light chains; b12 levels-Dr.B    Cindy JONELLE Joe, MD 02/13/2024 3:43 PM

## 2025-02-11 ENCOUNTER — Inpatient Hospital Stay

## 2025-02-25 ENCOUNTER — Inpatient Hospital Stay: Admitting: Internal Medicine
